# Patient Record
Sex: Male | Born: 1960
Health system: Southern US, Community
[De-identification: ages and names within clinical notes are randomized; demographics above are authoritative.]

## PROBLEM LIST (undated history)

## (undated) DIAGNOSIS — N401 Enlarged prostate with lower urinary tract symptoms: Secondary | ICD-10-CM

## (undated) DIAGNOSIS — S8262XA Displaced fracture of lateral malleolus of left fibula, initial encounter for closed fracture: Secondary | ICD-10-CM

## (undated) DIAGNOSIS — N138 Other obstructive and reflux uropathy: Secondary | ICD-10-CM

## (undated) DIAGNOSIS — G4730 Sleep apnea, unspecified: Secondary | ICD-10-CM

## (undated) DIAGNOSIS — I10 Essential (primary) hypertension: Secondary | ICD-10-CM

## (undated) DIAGNOSIS — K219 Gastro-esophageal reflux disease without esophagitis: Secondary | ICD-10-CM

## (undated) DIAGNOSIS — F419 Anxiety disorder, unspecified: Secondary | ICD-10-CM

## (undated) DIAGNOSIS — F439 Reaction to severe stress, unspecified: Secondary | ICD-10-CM

## (undated) HISTORY — DX: Anxiety disorder, unspecified: F41.9

## (undated) HISTORY — PX: COLONOSCOPY: SHX174

## (undated) HISTORY — DX: Other obstructive and reflux uropathy: N40.1

## (undated) HISTORY — DX: Reaction to severe stress, unspecified: F43.9

## (undated) HISTORY — DX: Gastro-esophageal reflux disease without esophagitis: K21.9

## (undated) HISTORY — PX: APPENDECTOMY: SHX54

## (undated) HISTORY — DX: Essential (primary) hypertension: I10

## (undated) HISTORY — DX: Other obstructive and reflux uropathy: N13.8

## (undated) HISTORY — PX: POLYPECTOMY: SHX149

## (undated) HISTORY — DX: Sleep apnea, unspecified: G47.30

---

## 2012-03-22 ENCOUNTER — Encounter: Payer: Self-pay | Admitting: Cardiology

## 2012-07-12 ENCOUNTER — Ambulatory Visit (INDEPENDENT_AMBULATORY_CARE_PROVIDER_SITE_OTHER): Payer: 59 | Admitting: Physician Assistant

## 2012-07-12 VITALS — BP 128/82 | HR 82 | Temp 98.0°F | Resp 17 | Ht 72.0 in | Wt 252.0 lb

## 2012-07-12 DIAGNOSIS — K219 Gastro-esophageal reflux disease without esophagitis: Secondary | ICD-10-CM

## 2012-07-12 MED ORDER — RANITIDINE HCL 150 MG PO TABS: 150.0000 mg | ORAL_TABLET | Freq: Two times a day (BID) | ORAL | Status: DC

## 2012-07-12 MED ORDER — OMEPRAZOLE 20 MG PO CPDR: 20.0000 mg | DELAYED_RELEASE_CAPSULE | Freq: Every day | ORAL | Status: DC

## 2012-07-12 NOTE — Progress Notes (Signed)
  Subjective:    Patient ID: Michael Berger, male    DOB: December 10, 1960, 51 y.o.   MRN: [id number]  HPI 51 year old male presents with 3 day history of heartburn, acid reflux, and some emesis. States emesis has been liquid and only after he eats and is experiencing reflux.  Complains of some epigastric pain associated with meals, but denies any pain right now.  Also admits to sensation of esophagus tightening and feeling as if his food "won't go down."  Has had episodes of this sensation in the past.  Denies fever, chills, nausea, vomiting, diarrhea, or constipation.  He has been taking Tums prn which does help temporarily.      Review of Systems  Constitutional: Negative for fever and chills.  HENT: Positive for sore throat (burning).   Respiratory: Negative for cough and wheezing.   Gastrointestinal: Positive for vomiting (bile). Negative for nausea, abdominal pain, diarrhea and constipation.  All other systems reviewed and are negative.       Objective:   Physical Exam  Constitutional: He is oriented to person, place, and time. He appears well-developed and well-nourished.  HENT:  Head: Normocephalic and atraumatic.  Right Ear: External ear normal.  Left Ear: External ear normal.  Eyes: Conjunctivae normal are normal.  Neck: Normal range of motion.  Cardiovascular: Normal rate.   Pulmonary/Chest: Effort normal.  Abdominal: Soft. Bowel sounds are normal.  Lymphadenopathy:    He has no cervical adenopathy.  Neurological: He is alert and oriented to person, place, and time.  Psychiatric: He has a normal mood and affect. His behavior is normal. Judgment and thought content normal.          Assessment & Plan:   1. GERD (gastroesophageal reflux disease)  ranitidine (ZANTAC) 150 MG tablet, omeprazole (PRILOSEC) 20 MG capsule, Ambulatory referral to Gastroenterology   Start Zantac bid Omeprazole bid x 2 weeks, then once daily. Maalox or Mylanta prn Refer to GI. Follow up as needed.

## 2012-07-12 NOTE — Patient Instructions (Signed)

## 2012-09-24 ENCOUNTER — Ambulatory Visit (INDEPENDENT_AMBULATORY_CARE_PROVIDER_SITE_OTHER): Payer: 59 | Admitting: Physician Assistant

## 2012-09-24 VITALS — BP 148/98 | HR 87 | Temp 99.6°F | Resp 18 | Wt 241.0 lb

## 2012-09-24 DIAGNOSIS — R030 Elevated blood-pressure reading, without diagnosis of hypertension: Secondary | ICD-10-CM

## 2012-09-24 DIAGNOSIS — IMO0001 Reserved for inherently not codable concepts without codable children: Secondary | ICD-10-CM

## 2012-09-24 DIAGNOSIS — J029 Acute pharyngitis, unspecified: Secondary | ICD-10-CM

## 2012-09-24 DIAGNOSIS — R0989 Other specified symptoms and signs involving the circulatory and respiratory systems: Secondary | ICD-10-CM

## 2012-09-24 MED ORDER — FIRST-DUKES MOUTHWASH MT SUSP: 10.0000 mL | OROMUCOSAL | Status: DC | PRN

## 2012-09-24 MED ORDER — GUAIFENESIN ER 1200 MG PO TB12: 1.0000 | ORAL_TABLET | Freq: Two times a day (BID) | ORAL | Status: DC | PRN

## 2012-09-24 NOTE — Progress Notes (Signed)
Subjective:    Patient ID: Michael Berger, male    DOB: [birth year], 51 y.o.   MRN: [id number]  HPI   Michael Berger is a 51 yr old male here with 2 days of URI symptoms.  Complaining of sore throat and chest congestion.  Very little cough, but when he has coughed it has been productive.  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  He denies nasal congestion or sinus pressure.  Has had some headache.  No ear pain.  He took one Zicam, but didn't like the way it made him feel.  Has not tried anything else for symptoms.  Denies fever but Michael Berger states he had chills last night.  He had his flu shot at [facility] earlier this season.    BP is elevated today, pt states he was previously on antihypertensives, but then taken off.  Has home monitor but has not been checking pressures regularly.    Review of Systems  Constitutional: Positive for chills. Negative for fever.  HENT: Positive for sore throat. Negative for ear pain, congestion, rhinorrhea and sinus pressure.   Respiratory: Positive for cough. Negative for wheezing.   Cardiovascular: Negative.   Gastrointestinal: Negative.   Musculoskeletal: Negative.   Skin: Negative.   Neurological: Positive for headaches.       Objective:   Physical Exam  Vitals reviewed. Constitutional: He is oriented to person, place, and time. He appears well-developed and well-nourished. No distress.  HENT:  Head: Normocephalic and atraumatic.  Right Ear: Tympanic membrane and ear canal normal.  Left Ear: Tympanic membrane normal.  Nose: Mucosal edema present. Right sinus exhibits no maxillary sinus tenderness and no frontal sinus tenderness. Left sinus exhibits no maxillary sinus tenderness and no frontal sinus tenderness.  Mouth/Throat: Uvula is midline and mucous membranes are normal. Posterior oropharyngeal erythema present. No oropharyngeal exudate, posterior oropharyngeal edema or tonsillar abscesses.  Neck: Neck  supple.  Cardiovascular: Normal rate, regular rhythm and normal heart sounds.  Exam reveals no gallop and no friction rub.   No murmur heard. Pulmonary/Chest: Effort normal and breath sounds normal. He has no wheezes. He has no rales.  Lymphadenopathy:    He has no cervical adenopathy.  Neurological: He is alert and oriented to person, place, and time.  Skin: Skin is warm and dry.  Psychiatric: He has a normal mood and affect. His behavior is normal.      Filed Vitals:   09/24/12 0840  BP: 148/98  Pulse: 87  Temp: 99.6 F (37.6 C)  Resp: 18       Assessment & Plan:   1. Sore throat  Diphenhyd-Hydrocort-Nystatin (FIRST-DUKES MOUTHWASH) SUSP  2. Chest congestion  Guaifenesin (MUCINEX MAXIMUM STRENGTH) 1200 MG TB12  3. Elevated blood pressure      Michael Berger is a very pleasant 51 yr old male here with URI, likely viral.  He has a low grade temp of 99.36F today, but exam was normal aside from pharyngeal erythema.  Pt was insistent that he needed a ZPak.  Discussed with him that I didn't think that was necessary at this time.  Encouraged plenty of fluids and rest.  Mucinex BID for chest congestion and [facility]'s magic mouthwash for sore throat.  Discussed RTC precautions, which patient understands.  If he is not improving in 3-4 days he will call and we can send in a ZPak and cough medication if needed.  He will let us know sooner if  he is worsening.   BP today is elevated at 148/98.  Instructed patient to check pressures at home about twice per week for the next 4-5 weeks.  If he continues to see numbers in the 140s/90s or higher, he will RTC to discuss restarting antihypertensives.

## 2012-09-24 NOTE — Patient Instructions (Addendum)
Begin using Mucinex twice daily for chest congestion.  Use Magic Mouthwash as frequently as every two hours for sore throat relief.  You may also use Advil or Tylenol for pain relief.  Drink plenty of fluids and get plenty of rest.  You may call back if you need cough medication, or if you are not improving in 3-4 days and we can send a ZPak in.  If you feel like you are worsening (fever > 101.5, worsening cough, worsening sore throat) let us know sooner.    Check your blood pressure at home about twice a week for the next 4-5 weeks.  If it is persistently 140/90 or high, come back in so we can discuss starting a blood pressure medication.      Upper Respiratory Infection, Adult An upper respiratory infection (URI) is also sometimes known as the common cold. The upper respiratory tract includes the nose, sinuses, throat, trachea, and bronchi. Bronchi are the airways leading to the lungs. Most people improve within 1 week, but symptoms can last up to 2 weeks. A residual cough may last even longer.  CAUSES Many different viruses can infect the tissues lining the upper respiratory tract. The tissues become irritated and inflamed and often become very moist. Mucus production is also common. A cold is contagious. You can easily spread the virus to others by oral contact. This includes kissing, sharing a glass, coughing, or sneezing. Touching your mouth or nose and then touching a surface, which is then touched by another person, can also spread the virus. SYMPTOMS  Symptoms typically develop 1 to 3 days after you come in contact with a cold virus. Symptoms vary from person to person. They may include:  Runny nose.  Sneezing.  Nasal congestion.  Sinus irritation.  Sore throat.  Loss of voice (laryngitis).  Cough.  Fatigue.  Muscle aches.  Loss of appetite.  Headache.  Low-grade fever. DIAGNOSIS  You might diagnose your own cold based on familiar symptoms, since most people get a cold 2  to 3 times a year. Your caregiver can confirm this based on your exam. Most importantly, your caregiver can check that your symptoms are not due to another disease such as strep throat, sinusitis, pneumonia, asthma, or epiglottitis. Blood tests, throat tests, and X-rays are not necessary to diagnose a common cold, but they may sometimes be helpful in excluding other more serious diseases. Your caregiver will decide if any further tests are required. RISKS AND COMPLICATIONS  You may be at risk for a more severe case of the common cold if you smoke cigarettes, have chronic heart disease (such as heart failure) or lung disease (such as asthma), or if you have a weakened immune system. The very young and very old are also at risk for more serious infections. Bacterial sinusitis, middle ear infections, and bacterial pneumonia can complicate the common cold. The common cold can worsen asthma and chronic obstructive pulmonary disease (COPD). Sometimes, these complications can require emergency medical care and may be life-threatening. PREVENTION  The best way to protect against getting a cold is to practice good hygiene. Avoid oral or hand contact with people with cold symptoms. Wash your hands often if contact occurs. There is no clear evidence that vitamin C, vitamin E, echinacea, or exercise reduces the chance of developing a cold. However, it is always recommended to get plenty of rest and practice good nutrition. TREATMENT  Treatment is directed at relieving symptoms. There is no cure. Antibiotics are not effective,  because the infection is caused by a virus, not by bacteria. Treatment may include:  Increased fluid intake. Sports drinks offer valuable electrolytes, sugars, and fluids.  Breathing heated mist or steam (vaporizer or shower).  Eating chicken soup or other clear broths, and maintaining good nutrition.  Getting plenty of rest.  Using gargles or lozenges for comfort.  Controlling fevers  with ibuprofen or acetaminophen as directed by your caregiver.  Increasing usage of your inhaler if you have asthma. Zinc gel and zinc lozenges, taken in the first 24 hours of the common cold, can shorten the duration and lessen the severity of symptoms. Pain medicines may help with fever, muscle aches, and throat pain. A variety of non-prescription medicines are available to treat congestion and runny nose. Your caregiver can make recommendations and may suggest nasal or lung inhalers for other symptoms.  HOME CARE INSTRUCTIONS   Only take over-the-counter or prescription medicines for pain, discomfort, or fever as directed by your caregiver.  Use a warm mist humidifier or inhale steam from a shower to increase air moisture. This may keep secretions moist and make it easier to breathe.  Drink enough water and fluids to keep your urine clear or pale yellow.  Rest as needed.  Return to work when your temperature has returned to normal or as your caregiver advises. You may need to stay home longer to avoid infecting others. You can also use a face mask and careful hand washing to prevent spread of the virus. SEEK MEDICAL CARE IF:   After the first few days, you feel you are getting worse rather than better.  You need your caregiver's advice about medicines to control symptoms.  You develop chills, worsening shortness of breath, or brown or red sputum. These may be signs of pneumonia.  You develop yellow or brown nasal discharge or pain in the face, especially when you bend forward. These may be signs of sinusitis.  You develop a fever, swollen neck glands, pain with swallowing, or white areas in the back of your throat. These may be signs of strep throat. SEEK IMMEDIATE MEDICAL CARE IF:   You have a fever.  You develop severe or persistent headache, ear pain, sinus pain, or chest pain.  You develop wheezing, a prolonged cough, cough up blood, or have a change in your usual mucus (if you  have chronic lung disease).  You develop sore muscles or a stiff neck. Document Released: 03/23/2001 Document Revised: 12/20/2011 Document Reviewed: 01/29/2011 [facility] Patient Information 2013 [place], [place].

## 2013-03-30 ENCOUNTER — Encounter: Payer: 59 | Admitting: Family Medicine

## 2013-07-06 ENCOUNTER — Ambulatory Visit (INDEPENDENT_AMBULATORY_CARE_PROVIDER_SITE_OTHER): Payer: 59 | Admitting: Family Medicine

## 2013-07-06 ENCOUNTER — Encounter: Payer: Self-pay | Admitting: Family Medicine

## 2013-07-06 VITALS — BP 150/104 | HR 73 | Temp 97.4°F | Resp 16 | Ht 71.0 in | Wt 235.0 lb

## 2013-07-06 DIAGNOSIS — Z23 Encounter for immunization: Secondary | ICD-10-CM

## 2013-07-06 DIAGNOSIS — E669 Obesity, unspecified: Secondary | ICD-10-CM

## 2013-07-06 DIAGNOSIS — R030 Elevated blood-pressure reading, without diagnosis of hypertension: Secondary | ICD-10-CM

## 2013-07-06 DIAGNOSIS — Z1211 Encounter for screening for malignant neoplasm of colon: Secondary | ICD-10-CM

## 2013-07-06 DIAGNOSIS — Z0000 Encounter for general adult medical examination without abnormal findings: Secondary | ICD-10-CM

## 2013-07-06 LAB — COMPREHENSIVE METABOLIC PANEL
ALT: 32 U/L (ref 0–53)
AST: 27 U/L (ref 0–37)
Albumin: 4.8 g/dL (ref 3.5–5.2)
Alkaline Phosphatase: 60 U/L (ref 39–117)
BUN: 17 mg/dL (ref 6–23)
CO2: 25 mEq/L (ref 19–32)
Calcium: 9.6 mg/dL (ref 8.4–10.5)
Chloride: 106 mEq/L (ref 96–112)
Creat: 1.33 mg/dL (ref 0.50–1.35)
Glucose, Bld: 92 mg/dL (ref 70–99)
Potassium: 4.3 mEq/L (ref 3.5–5.3)
Sodium: 138 mEq/L (ref 135–145)
Total Bilirubin: 0.6 mg/dL (ref 0.3–1.2)
Total Protein: 7.9 g/dL (ref 6.0–8.3)

## 2013-07-06 LAB — CBC
HCT: 47.9 % (ref 39.0–52.0)
Hemoglobin: 17 g/dL (ref 13.0–17.0)
MCH: 28.3 pg (ref 26.0–34.0)
MCHC: 35.5 g/dL (ref 30.0–36.0)
MCV: 79.8 fL (ref 78.0–100.0)
Platelets: 205 10*3/uL (ref 150–400)
RBC: 6 MIL/uL — ABNORMAL HIGH (ref 4.22–5.81)
RDW: 14.8 % (ref 11.5–15.5)
WBC: 6 10*3/uL (ref 4.0–10.5)

## 2013-07-06 LAB — LIPID PANEL
Cholesterol: 193 mg/dL (ref 0–200)
HDL: 40 mg/dL (ref >39)
LDL Cholesterol: 125 mg/dL — ABNORMAL HIGH (ref 0–99)
Total CHOL/HDL Ratio: 4.8 Ratio
Triglycerides: 142 mg/dL (ref <150)
VLDL: 28 mg/dL (ref 0–40)

## 2013-07-06 LAB — POCT URINALYSIS DIPSTICK
Bilirubin, UA: NEGATIVE
Glucose, UA: NEGATIVE
Ketones, UA: NEGATIVE
Leukocytes, UA: NEGATIVE
Nitrite, UA: NEGATIVE
Protein, UA: NEGATIVE
Spec Grav, UA: 1.025
Urobilinogen, UA: 0.2
pH, UA: 5.5

## 2013-07-06 LAB — IFOBT (OCCULT BLOOD): IFOBT: NEGATIVE

## 2013-07-06 MED ORDER — TADALAFIL 10 MG PO TABS: 10.0000 mg | ORAL_TABLET | Freq: Every day | ORAL | Status: DC | PRN

## 2013-07-06 NOTE — Patient Instructions (Signed)
Keeping you healthy  Get these tests  Blood pressure- Have your blood pressure checked once a year by your healthcare provider.  Normal blood pressure is 120/80  Weight- Have your body mass index (BMI) calculated to screen for obesity.  BMI is a measure of body fat based on height and weight. You can also calculate your own BMI at [email or web address].  Cholesterol- Have your cholesterol checked every year.  Diabetes- Have your blood sugar checked regularly if you have high blood pressure, high cholesterol, have a family history of diabetes or if you are overweight.  Screening for Colon Cancer- Colonoscopy starting at age 4.  Screening may begin sooner depending on your family history and other health conditions. Follow up colonoscopy as directed by your Gastroenterologist.  Screening for Prostate Cancer- Both blood work (PSA) and a rectal exam help screen for Prostate Cancer.  Screening begins at age 4 with African-American men and at age 69 with Caucasian men.  Screening may begin sooner depending on your family history.  Take these medicines  Aspirin- One aspirin daily can help prevent Heart disease and Stroke.  Flu shot- Every fall.  Tetanus- Every 10 years. You received a Tdap today; next Tetanus is due in 10 years.  Zostavax- Once after the age of 12 to prevent Shingles.  Pneumonia shot- Once after the age of 81; if you are younger than 98, ask your healthcare provider if you need a Pneumonia shot.  Take these steps  Don't smoke- If you do smoke, talk to your doctor about quitting.  For tips on how to quit, go to www.smokefree.gov or call 1-800-QUIT-NOW.  Be physically active- Exercise 5 days a week for at least 30 minutes.  If you are not already physically active start slow and gradually work up to 30 minutes of moderate physical activity.  Examples of moderate activity include walking briskly, mowing the yard, dancing, swimming, bicycling, etc.  Eat a healthy diet-  Eat a variety of healthy food such as fruits, vegetables, low fat milk, low fat cheese, yogurt, lean meant, poultry, fish, beans, tofu, etc. For more information go to www.thenutritionsource.org  Drink alcohol in moderation- Limit alcohol intake to less than two drinks a day. Never drink and drive.  Dentist- Brush and floss twice daily; visit your dentist twice a year.  Depression- Your emotional health is as important as your physical health. If you're feeling down, or losing interest in things you would normally enjoy please talk to your healthcare provider.  Eye exam- Visit your eye doctor every year.  Safe sex- If you may be exposed to a sexually transmitted infection, use a condom.  Seat belts- Seat belts can save your life; always wear one.  Smoke/Carbon Monoxide detectors- These detectors need to be installed on the appropriate level of your home.  Replace batteries at least once a year.  Skin cancer- When out in the sun, cover up and use sunscreen 15 SPF or higher.  Violence- If anyone is threatening you, please tell your healthcare provider.  Living Will/ Health care power of attorney- Speak with your healthcare provider and family.     Hypertension As your heart beats, it forces blood through your arteries. This force is your blood pressure. If the pressure is too high, it is called hypertension (HTN) or high blood pressure. HTN is dangerous because you may have it and not know it. High blood pressure may mean that your heart has to work harder to pump blood. Your arteries may be  narrow or stiff. The extra work puts you at risk for heart disease, stroke, and other problems.  Blood pressure consists of two numbers, a higher number over a lower, 110/72, for example. It is stated as "110 over 72." The ideal is below 120 for the top number (systolic) and under 80 for the bottom (diastolic). Write down your blood pressure today. You should pay close attention to your blood pressure  if you have certain conditions such as:  Heart failure.  Prior heart attack.  Diabetes  Chronic kidney disease.  Prior stroke.  Multiple risk factors for heart disease. To see if you have HTN, your blood pressure should be measured while you are seated with your arm held at the level of the heart. It should be measured at least twice. A one-time elevated blood pressure reading (especially in the Emergency Department) does not mean that you need treatment. There may be conditions in which the blood pressure is different between your right and left arms. It is important to see your caregiver soon for a recheck. Most people have essential hypertension which means that there is not a specific cause. This type of high blood pressure may be lowered by changing lifestyle factors such as:  Stress.  Smoking.  Lack of exercise.  Excessive weight.  Drug/tobacco/alcohol use.  Eating less salt. Most people do not have symptoms from high blood pressure until it has caused damage to the body. Effective treatment can often prevent, delay or reduce that damage. TREATMENT  When a cause has been identified, treatment for high blood pressure is directed at the cause. There are a large number of medications to treat HTN. These fall into several categories, and your caregiver will help you select the medicines that are best for you. Medications may have side effects. You should review side effects with your caregiver. If your blood pressure stays high after you have made lifestyle changes or started on medicines,   Your medication(s) may need to be changed.  Other problems may need to be addressed.  Be certain you understand your prescriptions, and know how and when to take your medicine.  Be sure to follow up with your caregiver within the time frame advised (usually within two weeks) to have your blood pressure rechecked and to review your medications.  If you are taking more than one medicine to  lower your blood pressure, make sure you know how and at what times they should be taken. Taking two medicines at the same time can result in blood pressure that is too low. SEEK IMMEDIATE MEDICAL CARE IF:  You develop a severe headache, blurred or changing vision, or confusion.  You have unusual weakness or numbness, or a faint feeling.  You have severe chest or abdominal pain, vomiting, or breathing problems. MAKE SURE YOU:   Understand these instructions.  Will watch your condition.  Will get help right away if you are not doing well or get worse. Document Released: 09/27/2005 Document Revised: 12/20/2011 Document Reviewed: 05/17/2008 [facility] Patient Information 2014 [place], [place].

## 2013-07-06 NOTE — Progress Notes (Signed)
Subjective:    Patient ID: Michael Berger, male    DOB: 18-Dec-1960, 52 y.o.   MRN: [id number]  HPI  This 52 y.o. AA male is here for CPE> He has had elevated BP in the past but is not currently taking medication. He is asymptomatic. Pt acknowledges that he is overweight; his goal is to lose ~ 20 lbs by increasing exercise and modifying nutrition.  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 He requests RX for Cialis.  HCM: CRS- needs referral.           Vision- every 2 years.           IMM- needs Tdap.   Review of Systems  Constitutional: Negative.   HENT: Negative.   Eyes: Negative.   Respiratory: Negative.   Cardiovascular: Negative.   Gastrointestinal: Negative.   Endocrine: Negative.   Genitourinary: Negative.   Musculoskeletal: Negative.   Skin: Negative.   Allergic/Immunologic: Negative.   Neurological: Negative.   Hematological: Negative.   Psychiatric/Behavioral: Negative.        Objective:   Physical Exam  Nursing note and vitals reviewed. Constitutional: He is oriented to person, place, and time. He appears well-developed and well-nourished. No distress.  Pt very muscular.  HENT:  Head: Normocephalic and atraumatic.  Eyes: EOM and lids are normal. Pupils are equal, round, and reactive to light. Right conjunctiva is injected. Left conjunctiva is injected. No scleral icterus.  Fundoscopic exam:      The right eye shows arteriolar narrowing. The right eye shows no papilledema. The right eye shows red reflex.       The left eye shows arteriolar narrowing. The left eye shows no papilledema. The left eye shows red reflex.  Fundoscopic exam difficult.  Neck: Full passive range of motion without pain. Neck supple. No hepatojugular reflux and no JVD present. No spinous process tenderness and no muscular tenderness present. Normal range of motion present. No mass and no thyromegaly present.  Slight reduction in ROM -rotation, flexion and extension.  Cardiovascular:  Normal rate, regular rhythm, S1 normal, S2 normal and intact distal pulses.   No extrasystoles are present. Exam reveals distant heart sounds. Exam reveals no gallop and no friction rub.   No murmur heard. Pulses:      Carotid pulses are 1+ on the right side, and 1+ on the left side.      Radial pulses are 1+ on the right side, and 1+ on the left side.       Femoral pulses are 1+ on the right side, and 1+ on the left side.      Popliteal pulses are 1+ on the right side, and 1+ on the left side.       Dorsalis pedis pulses are 1+ on the right side, and 1+ on the left side.       Posterior tibial pulses are 1+ on the right side, and 1+ on the left side.  Pulmonary/Chest: Effort normal and breath sounds normal. No respiratory distress.  BS distant.  Abdominal: Soft. Normal appearance and bowel sounds are normal. He exhibits no distension, no pulsatile midline mass and no mass. There is no hepatosplenomegaly. There is no tenderness. There is no guarding and no CVA tenderness. No hernia.  Adipose tissue prohibits adequate assessment of intra-abd organs.  Genitourinary: Rectum normal and prostate normal. Rectal exam shows no external hemorrhoid, no fissure, no mass, no tenderness and anal tone normal. Guaiac negative stool. Prostate is not tender.  Musculoskeletal: Normal range of motion. He exhibits no edema and no tenderness.  Lymphadenopathy:       Head (right side): No submental, no submandibular, no tonsillar, no posterior auricular and no occipital adenopathy present.       Head (left side): No submental, no submandibular, no tonsillar, no posterior auricular and no occipital adenopathy present.    He has no cervical adenopathy.       Right: No inguinal and no supraclavicular adenopathy present.       Left: No inguinal and no supraclavicular adenopathy present.  Neurological: He is alert and oriented to person, place, and time. He has normal strength. He displays no atrophy. No cranial nerve  deficit or sensory deficit. He exhibits normal muscle tone. Coordination and gait normal.  Skin: Skin is warm, dry and intact. No ecchymosis, no lesion and no rash noted. He is not diaphoretic. No cyanosis. Nails show no clubbing.  Psychiatric: He has a normal mood and affect. His speech is normal and behavior is normal. Judgment and thought content normal. Cognition and memory are normal.   BP re-check at end of visit: 140/98   Results for orders placed in visit on 07/06/13  POCT URINALYSIS DIPSTICK      Result Value Range   Color, UA yellow     Clarity, UA clear     Glucose, UA neg     Bilirubin, UA neg     Ketones, UA neg     Spec Grav, UA 1.025     Blood, UA trace-intact     pH, UA 5.5     Protein, UA neg     Urobilinogen, UA 0.2     Nitrite, UA neg     Leukocytes, UA Negative    IFOBT (OCCULT BLOOD)      Result Value Range   IFOBT Negative      ECG: 2011- NSR; no ST-T wave changes.     Assessment & Plan:  Routine general medical examination at a health care facility - Plan: POCT urinalysis dipstick, IFOBT POC (occult bld, rslt in office), Tdap vaccine greater than or equal to 7yo IM, CBC, Comprehensive metabolic panel, Lipid panel, PSA  Elevated blood pressure reading without diagnosis of hypertension - Monitor BP at home; modify nutrition and exercise for weight loss.   Plan: T3, free, TSH  Obesity, unspecified- Discussed nutrition changes; pt tends to eat fast food for breakfast (sausage biscuits, tater tots, etc). Advised to decrease portion sizes and processed foods; increase fruits and vegetables. No added salt.  Screening for colorectal cancer - Plan: Ambulatory referral to Gastroenterology

## 2013-07-07 LAB — PSA: PSA: 0.98 ng/mL (ref <=4.00)

## 2013-07-07 LAB — T3, FREE: T3, Free: 4 pg/mL (ref 2.3–4.2)

## 2013-07-07 LAB — TSH: TSH: 2.152 u[IU]/mL (ref 0.350–4.500)

## 2013-07-12 NOTE — Progress Notes (Signed)
Quick Note:  Please advise pt regarding following labs...  All labs are normal with an above normal LDL ("bad") cholesterol. Better nutrition and regular physcial activity will help lower the LDL cholesterol. Your heart disease risk is average with these lipid values.  Copy to pt. ______

## 2013-07-23 ENCOUNTER — Other Ambulatory Visit: Payer: Self-pay | Admitting: Physician Assistant

## 2013-07-23 ENCOUNTER — Ambulatory Visit (INDEPENDENT_AMBULATORY_CARE_PROVIDER_SITE_OTHER): Payer: 59 | Admitting: Family Medicine

## 2013-07-23 ENCOUNTER — Ambulatory Visit: Payer: 59

## 2013-07-23 VITALS — BP 140/90 | HR 77 | Temp 97.7°F | Resp 16 | Ht 71.5 in | Wt 230.0 lb

## 2013-07-23 DIAGNOSIS — K219 Gastro-esophageal reflux disease without esophagitis: Secondary | ICD-10-CM

## 2013-07-23 DIAGNOSIS — R030 Elevated blood-pressure reading, without diagnosis of hypertension: Secondary | ICD-10-CM

## 2013-07-23 DIAGNOSIS — N529 Male erectile dysfunction, unspecified: Secondary | ICD-10-CM

## 2013-07-23 DIAGNOSIS — R42 Dizziness and giddiness: Secondary | ICD-10-CM

## 2013-07-23 MED ORDER — TADALAFIL 10 MG PO TABS: 10.0000 mg | ORAL_TABLET | Freq: Every day | ORAL | Status: DC | PRN

## 2013-07-23 MED ORDER — OMEPRAZOLE 20 MG PO CPDR: 20.0000 mg | DELAYED_RELEASE_CAPSULE | Freq: Every day | ORAL | Status: DC

## 2013-07-23 NOTE — Progress Notes (Signed)
[facility] [address], [place] [address] [phone]  Date:  07/23/2013   Name:  Michael Berger   DOB:  08-Mar-1961   MRN:  [id number]  PCP:  No primary provider on file.    Chief Complaint: Gastrophageal Reflux, Sneezing and Medication Refill   History of Present Illness:  Michael Berger is a 52 y.o. very pleasant male patient who presents with the following:  Here today for a medication refill and for a couple of other concerns.   He has taken omeprazole as needed in the past.  He has had reflux for a year or so and needs a refill today  He used to eat a lot of peppermints and drink lemonade; he has stopped this and is overall improved.    For the last couple of days he thinks that his reflux has acted up again due to spicy foods.  He does not have abdominal pain or CP. However he has noted some discomfort in his abdomen and has vomited a few times, which is typical of reflux for him.    He was noted to have elelvated BP when he went for his colonoscopy pre-check last week  He is concerned about an episode that happened about 10 days ago; he went outside and suddenly felt very weak and tired.  He was sweating and felt SOB.  No CP.  He laid down and felt better in about 10 minutes.  He did eat a snack which seemed to help. He is worried about this episode and wants to be sure all is ok  He also notes that he has had a slight stuffy nose and sneezing for the past couple of days  Patient Active Problem List   Diagnosis Date Noted  . Elevated blood pressure reading without diagnosis of hypertension 07/06/2013  . Obesity, unspecified 07/06/2013    Past Medical History  Diagnosis Date  . Anxiety   . Stress   . Hypertension   . GERD (gastroesophageal reflux disease)     Past Surgical History  Procedure Laterality Date  . Appendectomy      Age 45    History  Substance Use Topics  . Smoking status: Never Smoker   . Smokeless tobacco: Not on file   . Alcohol Use: No    Family History  Problem Relation Age of Onset  . Hypertension Mother     No Known Allergies  Medication list has been reviewed and updated.  Current Outpatient Prescriptions on File Prior to Visit  Medication Sig Dispense Refill  . omeprazole (PRILOSEC) 20 MG capsule Take 1 capsule (20 mg total) by mouth daily.  30 capsule  3  . tadalafil (CIALIS) 10 MG tablet Take 1 tablet (10 mg total) by mouth daily as needed for erectile dysfunction.  10 tablet  0   No current facility-administered medications on file prior to visit.    Review of Systems:  As per HPI- otherwise negative.   Physical Examination: Filed Vitals:   07/23/13 1033  BP: 140/90  Pulse: 77  Temp: 97.7 F (36.5 C)  Resp: 16   Filed Vitals:   07/23/13 1033  Height: 5' 11.5" (1.816 m)  Weight: 230 lb (104.327 kg)   Body mass index is 31.63 kg/(m^2). Ideal Body Weight: Weight in (lb) to have BMI = 25: 181.4  GEN: WDWN, NAD, Non-toxic, A & O x 3, looks well HEENT: Atraumatic, Normocephalic. Neck supple. No masses, No LAD.  Bilateral TM  wnl, oropharynx normal.  PEERL,EOMI.   Ears and Nose: No external deformity. CV: RRR, No M/G/R. No JVD. No thrill. No extra heart sounds. PULM: CTA B, no wheezes, crackles, rhonchi. No retractions. No resp. distress. No accessory muscle use. ABD: S, NT, ND. No rebound. No HSM. EXTR: No c/c/e NEURO Normal gait.  PSYCH: Normally interactive. Conversant. Not depressed or anxious appearing.  Calm demeanor.   EKG: low voltage, otherwise normal  [facility] reading (PRIMARY) by  Dr. Patsy Lager. CXR:  Enlarged heart, lungs appear clear.    Assessment and Plan: Borderline high blood pressure - Plan: EKG 12-Lead  Lightheaded - Plan: DG Chest 2 View  GERD (gastroesophageal reflux disease) - Plan: omeprazole (PRILOSEC) 20 MG capsule  Erectile dysfunction - Plan: tadalafil (CIALIS) 10 MG tablet  Jermarion is here today with a few concerns.  Refilled his prilosec and  cialis. Instructed him to make sure cardiology feels he is safe to take cialis prior to using this again.   He has a concerning appearance of his heart on CXR- ? Possible pericardial effusion- as well as low voltage on EKG.   Referral to [facility] today- appreciate their care of this nice patient.    BP is borderline today; encouraged weight loss of 5 to 10 lbs and continue to follow;    Signed Abbe Amsterdam, MD

## 2013-07-23 NOTE — Patient Instructions (Signed)
Please go to [facility] at 1:30 this afternoon for an evaluation  [address] [phone]  I will refill your other medications, but make sure all is ok with your heart prior to using the Cialis.

## 2013-09-22 ENCOUNTER — Ambulatory Visit (INDEPENDENT_AMBULATORY_CARE_PROVIDER_SITE_OTHER): Payer: 59 | Admitting: Emergency Medicine

## 2013-09-22 VITALS — BP 118/70 | HR 81 | Temp 98.1°F | Resp 16 | Ht 72.0 in | Wt 233.8 lb

## 2013-09-22 DIAGNOSIS — J018 Other acute sinusitis: Secondary | ICD-10-CM

## 2013-09-22 DIAGNOSIS — H1030 Unspecified acute conjunctivitis, unspecified eye: Secondary | ICD-10-CM

## 2013-09-22 MED ORDER — PSEUDOEPHEDRINE-GUAIFENESIN ER 60-600 MG PO TB12: 1.0000 | ORAL_TABLET | Freq: Two times a day (BID) | ORAL | Status: AC

## 2013-09-22 MED ORDER — OFLOXACIN 0.3 % OP SOLN: 1.0000 [drp] | Freq: Four times a day (QID) | OPHTHALMIC | Status: DC

## 2013-09-22 MED ORDER — AMOXICILLIN-POT CLAVULANATE 875-125 MG PO TABS: 1.0000 | ORAL_TABLET | Freq: Two times a day (BID) | ORAL | Status: DC

## 2013-09-22 NOTE — Progress Notes (Signed)
[facility] [address], [place] [address] [phone]  Date:  09/22/2013   Name:  Michael Berger   DOB:  [birth year]   MRN:  [id number]  PCP:  No primary provider on file.    Chief Complaint: Cough, Sore Throat, Conjunctivitis and Nasal Congestion   History of Present Illness:  Michael Berger is a 52 y.o. very pleasant male patient who presents with the following:  Ill this week with sore throat and stopped up nose.  Has some purulent drainage.  No fever but has chills.  Headache.  Left eye is bothering him and has a thick yellow drainage.  Mostly non productive cough.  Sometimes purulent.  Says he feels some difficulty breathing with exertion .  No wheezing.  No improvement with over the counter medications or other home remedies. Denies other complaint or health concern today.   Patient Active Problem List   Diagnosis Date Noted  . Elevated blood pressure reading without diagnosis of hypertension 07/06/2013  . Obesity, unspecified 07/06/2013    Past Medical History  Diagnosis Date  . Anxiety   . Stress   . Hypertension   . GERD (gastroesophageal reflux disease)     Past Surgical History  Procedure Laterality Date  . Appendectomy      Age 31    History  Substance Use Topics  . Smoking status: Current Some Day Smoker    Types: Cigars  . Smokeless tobacco: Not on file  . Alcohol Use: No    Family History  Problem Relation Age of Onset  . Hypertension Mother     No Known Allergies  Medication list has been reviewed and updated.  Current Outpatient Prescriptions on File Prior to Visit  Medication Sig Dispense Refill  . omeprazole (PRILOSEC) 20 MG capsule Take 1 capsule (20 mg total) by mouth daily.  30 capsule  3  . tadalafil (CIALIS) 10 MG tablet Take 1 tablet (10 mg total) by mouth daily as needed for erectile dysfunction.  10 tablet  6   No current facility-administered medications on file prior to visit.    Review of  Systems:  As per HPI, otherwise negative.    Physical Examination: Filed Vitals:   09/22/13 1237  BP: 118/70  Pulse: 81  Temp: 98.1 F (36.7 C)  Resp: 16   Filed Vitals:   09/22/13 1237  Height: 6' (1.829 m)  Weight: 233 lb 12.8 oz (106.051 kg)   Body mass index is 31.7 kg/(m^2). Ideal Body Weight: Weight in (lb) to have BMI = 25: 183.9  GEN: WDWN, NAD, Non-toxic, A & O x 3 HEENT: Atraumatic, Normocephalic. Neck supple. No masses, No LAD.  Left conjunctival injection and marginal lid crusting.   Ears and Nose: No external deformity. CV: RRR, No M/G/R. No JVD. No thrill. No extra heart sounds. PULM: CTA B, no wheezes, crackles, rhonchi. No retractions. No resp. distress. No accessory muscle use. ABD: S, NT, ND, +BS. No rebound. No HSM. EXTR: No c/c/e NEURO Normal gait.  PSYCH: Normally interactive. Conversant. Not depressed or anxious appearing.  Calm demeanor.    Assessment and Plan: Sinusitis Conjunctivitis Ocuflox augemntin mucinex d  Signed,  Phillips Odor, MD

## 2013-09-22 NOTE — Patient Instructions (Signed)
Conjunctivitis °Conjunctivitis is commonly called "pink eye." Conjunctivitis can be caused by bacterial or viral infection, allergies, or injuries. There is usually redness of the lining of the eye, itching, discomfort, and sometimes discharge. There may be deposits of matter along the eyelids. A viral infection usually causes a watery discharge, while a bacterial infection causes a yellowish, thick discharge. Pink eye is very contagious and spreads by direct contact. °You may be given antibiotic eyedrops as part of your treatment. Before using your eye medicine, remove all drainage from the eye by washing gently with warm water and cotton balls. Continue to use the medication until you have awakened 2 mornings in a row without discharge from the eye. Do not rub your eye. This increases the irritation and helps spread infection. Use separate towels from other household members. Wash your hands with soap and water before and after touching your eyes. Use cold compresses to reduce pain and sunglasses to relieve irritation from light. Do not wear contact lenses or wear eye makeup until the infection is gone. °SEEK MEDICAL CARE IF:  °· Your symptoms are not better after 3 days of treatment. °· You have increased pain or trouble seeing. °· The outer eyelids become very red or swollen. °Document Released: 11/04/2004 Document Revised: 12/20/2011 Document Reviewed: 09/27/2005 °ExitCare® Patient Information ©2014 [employer]. ° °Sinusitis °Sinusitis is redness, soreness, and swelling (inflammation) of the paranasal sinuses. Paranasal sinuses are air pockets within the bones of your face (beneath the eyes, the middle of the forehead, or above the eyes). In healthy paranasal sinuses, mucus is able to drain out, and air is able to circulate through them by way of your nose. However, when your paranasal sinuses are inflamed, mucus and air can become trapped. This can allow bacteria and other germs to grow and cause  infection. °Sinusitis can develop quickly and last only a short time (acute) or continue over a long period (chronic). Sinusitis that lasts for more than 12 weeks is considered chronic.  °CAUSES  °Causes of sinusitis include: °· Allergies. °· Structural abnormalities, such as displacement of the cartilage that separates your nostrils (deviated septum), which can decrease the air flow through your nose and sinuses and affect sinus drainage. °· Functional abnormalities, such as when the small hairs (cilia) that line your sinuses and help remove mucus do not work properly or are not present. °SYMPTOMS  °Symptoms of acute and chronic sinusitis are the same. The primary symptoms are pain and pressure around the affected sinuses. Other symptoms include: °· Upper toothache. °· Earache. °· Headache. °· Bad breath. °· Decreased sense of smell and taste. °· A cough, which worsens when you are lying flat. °· Fatigue. °· Fever. °· Thick drainage from your nose, which often is green and may contain pus (purulent). °· Swelling and warmth over the affected sinuses. °DIAGNOSIS  °Your caregiver will perform a physical exam. During the exam, your caregiver may: °· Look in your nose for signs of abnormal growths in your nostrils (nasal polyps). °· Tap over the affected sinus to check for signs of infection. °· View the inside of your sinuses (endoscopy) with a special imaging device with a light attached (endoscope), which is inserted into your sinuses. °If your caregiver suspects that you have chronic sinusitis, one or more of the following tests may be recommended: °· Allergy tests. °· Nasal culture A sample of mucus is taken from your nose and sent to a lab and screened for bacteria. °· Nasal cytology A sample   of mucus is taken from your nose and examined by your caregiver to determine if your sinusitis is related to an allergy. °TREATMENT  °Most cases of acute sinusitis are related to a viral infection and will resolve on their  own within 10 days. Sometimes medicines are prescribed to help relieve symptoms (pain medicine, decongestants, nasal steroid sprays, or saline sprays).  °However, for sinusitis related to a bacterial infection, your caregiver will prescribe antibiotic medicines. These are medicines that will help kill the bacteria causing the infection.  °Rarely, sinusitis is caused by a fungal infection. In theses cases, your caregiver will prescribe antifungal medicine. °For some cases of chronic sinusitis, surgery is needed. Generally, these are cases in which sinusitis recurs more than 3 times per year, despite other treatments. °HOME CARE INSTRUCTIONS  °· Drink plenty of water. Water helps thin the mucus so your sinuses can drain more easily. °· Use a humidifier. °· Inhale steam 3 to 4 times a day (for example, sit in the bathroom with the shower running). °· Apply a warm, moist washcloth to your face 3 to 4 times a day, or as directed by your caregiver. °· Use saline nasal sprays to help moisten and clean your sinuses. °· Take over-the-counter or prescription medicines for pain, discomfort, or fever only as directed by your caregiver. °SEEK IMMEDIATE MEDICAL CARE IF: °· You have increasing pain or severe headaches. °· You have nausea, vomiting, or drowsiness. °· You have swelling around your face. °· You have vision problems. °· You have a stiff neck. °· You have difficulty breathing. °MAKE SURE YOU:  °· Understand these instructions. °· Will watch your condition. °· Will get help right away if you are not doing well or get worse. °Document Released: 09/27/2005 Document Revised: 12/20/2011 Document Reviewed: 10/12/2011 °ExitCare® Patient Information ©2014 [employer]. ° °

## 2013-11-03 ENCOUNTER — Other Ambulatory Visit: Payer: Self-pay | Admitting: Family Medicine

## 2013-11-07 ENCOUNTER — Other Ambulatory Visit: Payer: Self-pay | Admitting: Family Medicine

## 2013-11-07 ENCOUNTER — Telehealth: Payer: Self-pay

## 2013-11-07 DIAGNOSIS — N529 Male erectile dysfunction, unspecified: Secondary | ICD-10-CM

## 2013-11-07 NOTE — Telephone Encounter (Signed)
PT STATES THE PHARMACY IS TRYING TO GET A REFILL ON HIS CIALIS, BUT WOULD LIKE TO KNOW IF DR MCPHERSON WOULD UP THE MGS. PLEASE CALL  [phone]   [facility] [address]

## 2013-11-08 MED ORDER — TADALAFIL 10 MG PO TABS: 10.0000 mg | ORAL_TABLET | Freq: Every day | ORAL | Status: DC | PRN

## 2013-11-08 NOTE — Telephone Encounter (Signed)
Please advise Dr. Leward Quan

## 2013-11-08 NOTE — Telephone Encounter (Signed)
I will refill Cialis at current dose. Pt had visit in Oct 2014 at 102 and note says he was being referred to [facility]; he was advised to discuss safety of continuing this drug with the specialist. He needs to schedule follow-up appt w/ me re: HTN in order to continue receiving this medication.

## 2013-11-09 NOTE — Telephone Encounter (Signed)
Spoke with patient and let him know about prescription.  He will call next week to schedule an appointment./

## 2014-04-26 ENCOUNTER — Encounter: Payer: Self-pay | Admitting: Family Medicine

## 2014-04-26 ENCOUNTER — Ambulatory Visit (INDEPENDENT_AMBULATORY_CARE_PROVIDER_SITE_OTHER): Payer: 59 | Admitting: Family Medicine

## 2014-04-26 VITALS — BP 127/86 | HR 75 | Temp 98.1°F | Resp 16 | Ht 71.5 in | Wt 233.4 lb

## 2014-04-26 DIAGNOSIS — N528 Other male erectile dysfunction: Secondary | ICD-10-CM

## 2014-04-26 DIAGNOSIS — N529 Male erectile dysfunction, unspecified: Secondary | ICD-10-CM | POA: Insufficient documentation

## 2014-04-26 DIAGNOSIS — I10 Essential (primary) hypertension: Secondary | ICD-10-CM

## 2014-04-26 MED ORDER — TADALAFIL 20 MG PO TABS: 20.0000 mg | ORAL_TABLET | Freq: Every day | ORAL | Status: DC | PRN

## 2014-04-26 MED ORDER — LOSARTAN POTASSIUM-HCTZ 100-12.5 MG PO TABS: 1.0000 | ORAL_TABLET | Freq: Every day | ORAL | Status: DC

## 2014-04-26 MED ORDER — OMEPRAZOLE 20 MG PO CPDR: 20.0000 mg | DELAYED_RELEASE_CAPSULE | Freq: Every day | ORAL | Status: DC

## 2014-04-28 DIAGNOSIS — I10 Essential (primary) hypertension: Secondary | ICD-10-CM | POA: Insufficient documentation

## 2014-04-28 NOTE — Progress Notes (Signed)
S:  This 53 y.o. AA male has HTN; he is complaint w/ medication w/o adverse effects. Pt has ED and has found Cialis 10 mg ineffective. He would like to try a higher dose. He had no adverse events w/ Cialis 10 mg.  Patient Active Problem List   Diagnosis Date Noted  . Erectile dysfunction 04/26/2014  . Elevated blood pressure reading without diagnosis of hypertension 07/06/2013  . Obesity, unspecified 07/06/2013    Prior to Admission medications   Medication Sig Start Date End Date Taking? Authorizing Provider  losartan-hydrochlorothiazide (HYZAAR) 100-12.5 MG per tablet Take 1 tablet by mouth daily.   Yes Barton Fanny, MD  omeprazole (PRILOSEC) 20 MG capsule Take 1 capsule (20 mg total) by mouth daily.   Yes Barton Fanny, MD  ofloxacin (OCUFLOX) 0.3 % ophthalmic solution Place 1 drop into the left eye 4 (four) times daily. 09/22/13   Ellison Carwin, MD  pseudoephedrine-guaifenesin [facility] D) 60-600 MG per tablet Take 1 tablet by mouth every 12 (twelve) hours. 09/22/13 09/22/14  Ellison Carwin, MD  tadalafil (CIALIS) 10 MG tablet Take 1 tablet (10 mg total) by mouth daily as needed for erectile dysfunction.    Barton Fanny, MD   PMHx, Surg Hx, Soc and Fam Hx reviewed.  ROS: Negative for fatigue, diaphoresis, vision disturbances, CP or tightness, palpitations, edema, SOB or DOE, cough, abd or back pain, HA, dizziness, numbness, weakness or syncope.  O: Filed Vitals:   04/26/14 1210  BP: 127/86  Pulse: 75  Temp: 98.1 F (36.7 C)  Resp: 16   GEN: In NAD; WN,WD. HENT: [place]/AT. EOMI w/ clear conj/sclerae. Otherwise unremarkable. COR: RRR. LUNGS: Unlabored resp. SKIN: W&D; intact w/o diaphoresis or erythema. MS: MAEs; no deformities, c/c/e. NEURO: A&O x 3; CNs intact. Nonfocal.  A/P: Other male erectile dysfunction- Trial Cialis 20 mg  1 tablet as directed prior to sexual activity.  Essential hypertension- Stable and controlled on current medications.  Meds  ordered this encounter  Medications  . losartan-hydrochlorothiazide (HYZAAR) 100-12.5 MG per tablet    Sig: Take 1 tablet by mouth daily.    Dispense:  90 tablet    Refill:  3  . omeprazole (PRILOSEC) 20 MG capsule    Sig: Take 1 capsule (20 mg total) by mouth daily.    Dispense:  30 capsule    Refill:  3  . tadalafil (CIALIS) 20 MG tablet    Sig: Take 1 tablet (20 mg total) by mouth daily as needed for erectile dysfunction.    Dispense:  10 tablet    Refill:  3

## 2015-01-28 ENCOUNTER — Telehealth: Payer: Self-pay

## 2015-01-28 MED ORDER — SILDENAFIL CITRATE 50 MG PO TABS: 50.0000 mg | ORAL_TABLET | Freq: Every day | ORAL | Status: DC | PRN

## 2015-01-28 NOTE — Telephone Encounter (Signed)
Viagra 50 mg prescribed for pt to try. If this is not effective, he will need to sch OV to discuss; his last CPE was 07/06/2013. I last saw him in July 2015.

## 2015-01-28 NOTE — Telephone Encounter (Signed)
Pt wants a prescription for Viagra in place of his Cialis. The tadalafil (CIALIS) 20 MG tablet [[id number]]  gives him a headache. Please advise at [phone]

## 2015-01-29 NOTE — Telephone Encounter (Signed)
lmom informing pt

## 2015-04-13 ENCOUNTER — Other Ambulatory Visit: Payer: Self-pay | Admitting: Family Medicine

## 2015-06-13 ENCOUNTER — Other Ambulatory Visit: Payer: Self-pay | Admitting: Family Medicine

## 2015-06-13 NOTE — Telephone Encounter (Signed)
Last OV 04/26/2014, Last filled 04/26/2014

## 2015-06-18 ENCOUNTER — Ambulatory Visit (INDEPENDENT_AMBULATORY_CARE_PROVIDER_SITE_OTHER): Payer: Commercial Managed Care - HMO | Admitting: Family Medicine

## 2015-06-18 ENCOUNTER — Telehealth: Payer: Self-pay

## 2015-06-18 VITALS — BP 132/84 | HR 88 | Temp 98.4°F | Resp 16 | Ht 71.5 in | Wt 247.6 lb

## 2015-06-18 DIAGNOSIS — I10 Essential (primary) hypertension: Secondary | ICD-10-CM | POA: Diagnosis not present

## 2015-06-18 DIAGNOSIS — R0683 Snoring: Secondary | ICD-10-CM

## 2015-06-18 DIAGNOSIS — Z1211 Encounter for screening for malignant neoplasm of colon: Secondary | ICD-10-CM | POA: Diagnosis not present

## 2015-06-18 DIAGNOSIS — Z1212 Encounter for screening for malignant neoplasm of rectum: Secondary | ICD-10-CM | POA: Diagnosis not present

## 2015-06-18 DIAGNOSIS — R5382 Chronic fatigue, unspecified: Secondary | ICD-10-CM | POA: Diagnosis not present

## 2015-06-18 NOTE — Telephone Encounter (Signed)
Spoke with pt, advised him to RTC and to establish care with another doctor. I advised him Dr. Leward Quan has retired. Pt understood.

## 2015-06-18 NOTE — Progress Notes (Signed)
Subjective:    Patient ID: Michael Berger, male    DOB: 10-26-1960, 54 y.o.   MRN: [id number] This chart was scribed for Delman Cheadle, MD by Zola Button, Medical Scribe. This patient was seen in Room 5 and the patient's care was started at 2:31 PM.   Chief Complaint  Patient presents with  . referral for sleep study   HPI HPI Comments: Michael Berger is a 54 y.o. male who [facility] for a sleep study. Patient notes that he does snore and wakes up feeling fatigued. He feels he may stop breathing at night; sometimes he wakes up due to a sudden gag. He states he smokes a cigar occasionally. Patient denies sore throat and problems with allergies.  Patient called in earlier today asking for a referral for a sleep study. He had been seeing Dr. Leward Quan previously and agreed to establish care with another provider. He would like to establish care with me.  Past Medical History  Diagnosis Date  . Anxiety   . Stress   . Hypertension   . GERD (gastroesophageal reflux disease)    Current Outpatient Prescriptions on File Prior to Visit  Medication Sig Dispense Refill  . losartan-hydrochlorothiazide (HYZAAR) 100-12.5 MG per tablet Take 1 tablet by mouth daily. 90 tablet 3  . omeprazole (PRILOSEC) 20 MG capsule Take 1 capsule (20 mg total) by mouth daily. PATIENT NEEDS OFFICE VISIT FOR ADDITIONAL REFILLS 30 capsule 0  . sildenafil (VIAGRA) 50 MG tablet Take 1 tablet (50 mg total) by mouth daily as needed for erectile dysfunction. 10 tablet 1  . ofloxacin (OCUFLOX) 0.3 % ophthalmic solution Place 1 drop into the left eye 4 (four) times daily. 5 mL 0   No current facility-administered medications on file prior to visit.   No Known Allergies  Review of Systems  Constitutional: Positive for fatigue and unexpected weight change. Negative for fever, activity change and appetite change.  HENT: Negative for congestion, rhinorrhea and sore throat.     Respiratory: Positive for apnea. Negative for cough and shortness of breath.   Cardiovascular: Negative for chest pain and palpitations.  Allergic/Immunologic: Negative for environmental allergies.  Neurological: Positive for headaches. Negative for dizziness, syncope, weakness and light-headedness.  Psychiatric/Behavioral: Positive for sleep disturbance.       Objective:  BP 132/84 mmHg  Pulse 88  Temp(Src) 98.4 F (36.9 C) (Oral)  Resp 16  Ht 5' 11.5" (1.816 m)  Wt 247 lb 9.6 oz (112.311 kg)  BMI 34.06 kg/m2  SpO2 97%  Physical Exam  Constitutional: He is oriented to person, place, and time. He appears well-developed and well-nourished. No distress.  HENT:  Head: Normocephalic and atraumatic.  Mouth/Throat: Oropharynx is clear and moist. No oropharyngeal exudate.  Left tonsil 3+, right tonsil 1+. Nares pale and boggy, no rhinorrhea or congestion.   Eyes: Pupils are equal, round, and reactive to light.  Neck: Neck supple. No thyromegaly present.  Thyroid normal.  Cardiovascular: Normal rate.   Pulmonary/Chest: Effort normal.  Musculoskeletal: He exhibits no edema.  Lymphadenopathy:    He has no cervical adenopathy.  Neurological: He is alert and oriented to person, place, and time. No cranial nerve deficit.  Skin: Skin is warm and dry. No rash noted.  Psychiatric: He has a normal mood and affect. His behavior is normal.  Nursing note and vitals reviewed.         Assessment & Plan:   1. Chronic fatigue -  HIGHLY likely pt has osa - sxs and exam textbook - refer for split night sleep study.  2. Snoring   3. Essential hypertension, benign   4. Encounter for colorectal cancer screening - refer to GI    Orders Placed This Encounter  Procedures  . Ambulatory referral to Sleep Studies    Referral Priority:  Routine    Referral Type:  Consultation    Referral Reason:  Specialty Services Required    Number of Visits Requested:  1  . Ambulatory referral to  Gastroenterology    Referral Priority:  Routine    Referral Type:  Consultation    Referral Reason:  Specialty Services Required    Number of Visits Requested:  1     I personally performed the services described in this documentation, which was scribed in my presence. The recorded information has been reviewed and considered, and addended by me as needed.  Delman Cheadle, MD MPH

## 2015-06-18 NOTE — Telephone Encounter (Signed)
Pt is having issues with snoring and would like to have a referral for a sleep study we have not seen this patient for the problem before

## 2015-06-19 ENCOUNTER — Encounter: Payer: Self-pay | Admitting: Neurology

## 2015-06-19 ENCOUNTER — Encounter: Payer: Self-pay | Admitting: Gastroenterology

## 2015-06-19 ENCOUNTER — Ambulatory Visit (INDEPENDENT_AMBULATORY_CARE_PROVIDER_SITE_OTHER): Payer: Commercial Managed Care - HMO | Admitting: Neurology

## 2015-06-19 VITALS — BP 138/80 | HR 80 | Resp 18 | Ht 71.5 in | Wt 241.0 lb

## 2015-06-19 DIAGNOSIS — Z87898 Personal history of other specified conditions: Secondary | ICD-10-CM

## 2015-06-19 DIAGNOSIS — G4733 Obstructive sleep apnea (adult) (pediatric): Secondary | ICD-10-CM | POA: Diagnosis not present

## 2015-06-19 DIAGNOSIS — G4761 Periodic limb movement disorder: Secondary | ICD-10-CM | POA: Diagnosis not present

## 2015-06-19 DIAGNOSIS — R519 Headache, unspecified: Secondary | ICD-10-CM

## 2015-06-19 DIAGNOSIS — R51 Headache: Secondary | ICD-10-CM | POA: Diagnosis not present

## 2015-06-19 DIAGNOSIS — G4719 Other hypersomnia: Secondary | ICD-10-CM

## 2015-06-19 NOTE — Patient Instructions (Signed)

## 2015-06-19 NOTE — Progress Notes (Signed)
Subjective:    Patient ID: Michael Berger is a 54 y.o. male.  HPI     Star Age, MD, PhD [facility] [address] [place], [place] [address]  Dear Dr. Brigitte Pulse,   I saw your patient, Michael Berger, upon your kind request, in my neurologic clinic today for initial consultation of his sleep disorder, in particular, concern for underlying obstructive sleep apnea. The patient is unaccompanied today. As you know, Michael Berger is a 54 year old right-handed gentleman with an underlying medical history of hypertension, ED, and obesity, who reports snoring and excessive daytime somnolence and waking up with a sense of gagging and gasping.  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 He works 4 days on and 4 days off. He also works part-time during his 4 days off. He goes to bed around 9 PM but often falls asleep before than watching TV. He has come close to dozing off at the wheel but has never fallen asleep at the wheel. His rise time is 4:30 AM but sometimes he is already up around 3 or 3:30 AM and cannot go back to sleep. He denies any nocturia but has woken up with headaches. He has had morning headaches for the past several months. He has had loud snoring and abnormal noises while asleep. This has been going on for months or years. His wife has repeatedly asked him to be checked out for this. She often cannot sleep in the same bedroom with him. He has recently been started on testosterone replacement. He has a history of neck pain which has been going on for a few months. He saw orthopedics at [facility] for this. He had neck imaging testing which showed degenerative spine disease. He was referred to physical therapy and injection treatment was considered. He started going to physical therapy but then stopped going. He does admit that it was helpful and that his pain improved. He has some intermittent tingling in his hands bilaterally. He has no  frank pain. He denies restless leg symptoms but is known to twitch and kick in his sleep. His wife has reported that he kicks his leg and his sleep and he has woken himself up with leg jerking. His Epworth sleepiness score is 17 out of a maximum of 24, his fatigue score is 44 out of 63. He denies a family history of obstructive sleep apnea. He never smoked cigarettes but has rarely smoked cigars. He drinks alcohol occasionally and usually 1 cup of coffee per day on his days on. Very occasionally, a few times a month he works from 10 PM to 7 AM. He has gained weight in the past few years. He has one grown son age 21 who lives in [place], [place]. In the recent past he has had shortness of breath and chest tightness with minimal exertion. He was checked out for this by cardiology and saw Dr. Einar Gip, and test results were benign per patient.  His Past Medical History Is Significant For: Past Medical History  Diagnosis Date  . Anxiety   . Stress   . Hypertension   . GERD (gastroesophageal reflux disease)     His Past Surgical History Is Significant For: Past Surgical History  Procedure Laterality Date  . Appendectomy      Age 61    His Family History Is Significant For: Family History  Problem Relation Age of Onset  . Hypertension Mother     His  Social History Is Significant For: Social History   Social History  . Marital Status: Married    Spouse Name: Michael Berger  . Number of Children: 2  . Years of Education: Michael Berger   Occupational History  . law enforcement Unemployed    [place] History Main Topics  . Smoking status: Current Some Day Smoker    Types: Cigars  . Smokeless tobacco: Not on file     Comment: cigars  . Alcohol Use: 0.0 oz/week    0 Standard drinks or equivalent per week     Comment: Rare  . Drug Use: No  . Sexual Activity: Not on file   Other Topics Concern  . Not on file   Social History Narrative   Exercises 3 x's weekly.   Drinks coffee  about 8 times a month   His Allergies Are:  No Known Allergies:   His Current Medications Are:  Outpatient Encounter Prescriptions as of 06/19/2015  Medication Sig  . losartan-hydrochlorothiazide (HYZAAR) 100-12.5 MG per tablet Take 1 tablet by mouth daily.  Marland Kitchen ofloxacin (OCUFLOX) 0.3 % ophthalmic solution Place 1 drop into the left eye 4 (four) times daily.  Marland Kitchen omeprazole (PRILOSEC) 20 MG capsule Take 1 capsule (20 mg total) by mouth daily. PATIENT NEEDS OFFICE VISIT FOR ADDITIONAL REFILLS  . sildenafil (VIAGRA) 50 MG tablet Take 1 tablet (50 mg total) by mouth daily as needed for erectile dysfunction.   No facility-administered encounter medications on file as of 06/19/2015.  :  Review of Systems:  Out of a complete 14 point review of systems, all are reviewed and negative with the exception of these symptoms as listed below:   Review of Systems  Neurological:       No trouble falling but has trouble staying asleep, snoring, wakes up chocking or gasping for air, wakes up feeling tired, rarely takes naps, wakes up with headaches.     Objective:  Neurologic Exam  Physical Exam Physical Examination:   Filed Vitals:   06/19/15 1627  BP: 138/80  Pulse: 80  Resp: 18   General Examination: The patient is a very pleasant 54 y.o. male in no acute distress. He appears well-developed and well-nourished and well groomed.   HEENT: Normocephalic, atraumatic, pupils are equal, round and reactive to light and accommodation. Funduscopic exam is normal with sharp disc margins noted. Extraocular tracking is good without limitation to gaze excursion or nystagmus noted. Normal smooth pursuit is noted. Hearing is grossly intact. Tympanic membranes are clear bilaterally. Face is symmetric with normal facial animation and normal facial sensation. Speech is clear with no dysarthria noted. There is no hypophonia. There is no lip, neck/head, jaw or voice tremor. Neck is supple with full range of passive and  active motion. There are no carotid bruits on auscultation. Oropharynx exam reveals: mild mouth dryness, adequate dental hygiene and marked airway crowding, due to narrow airway entry, larger uvula, slightly swollen uvula, and large tonsils, 2-3+ bilaterally, right side a little bigger than left. Mallampati is class II. Tongue protrudes centrally and palate elevates symmetrically. Neck size is 19 1/8 inches. He has a Mild overbite. Nasal inspection reveals no significant nasal mucosal bogginess or redness and no septal deviation.   Chest: Clear to auscultation without wheezing, rhonchi or crackles noted.  Heart: S1+S2+0, regular and normal without murmurs, rubs or gallops noted.   Abdomen: Soft, non-tender and non-distended with normal bowel sounds appreciated on auscultation.  Extremities: There is no pitting edema  in the distal lower extremities bilaterally. Pedal pulses are intact.  Skin: Warm and dry without trophic changes noted. There are no varicose veins.  Musculoskeletal: exam reveals no obvious joint deformities, tenderness or joint swelling or erythema.   Neurologically:  Mental status: The patient is awake, alert and oriented in all 4 spheres. His immediate and remote memory, attention, language skills and fund of knowledge are appropriate. There is no evidence of aphasia, agnosia, apraxia or anomia. Speech is clear with normal prosody and enunciation. Thought process is linear. Mood is normal and affect is normal.  Cranial nerves II - XII are as described above under HEENT exam. In addition: shoulder shrug is normal with equal shoulder height noted. Motor exam: Normal bulk, strength and tone is noted. There is no drift, tremor or rebound. Romberg is negative. Reflexes are 2+ throughout. Babinski: Toes are flexor bilaterally. Fine motor skills and coordination: intact with normal finger taps, normal hand movements, normal rapid alternating patting, normal foot taps and normal foot  agility.  Cerebellar testing: No dysmetria or intention tremor on finger to nose testing. Heel to shin is unremarkable bilaterally. There is no truncal or gait ataxia.  Sensory exam: intact to light touch, pinprick, vibration, temperature sense in the upper and lower extremities.  Gait, station and balance: He stands easily. No veering to one side is noted. No leaning to one side is noted. Posture is age-appropriate and stance is narrow based. Gait shows normal stride length and normal pace. No problems turning are noted. He turns en bloc. Tandem walk is unremarkable.   Assessment and Plan:   In summary, Lerone Onder is a very pleasant 54 y.o.-year old male hypertension, ED, history of chest pain, history of shortness of breath, and obesity, whose history and physical exam are in keeping with obstructive sleep apnea (OSA). He reports significant excessive daytime somnolence, morning headaches, and also leg jerking and twitching in his sleep, in keeping with PLMD. I had a long chat with the patient about my findings and the diagnosis of OSA, its prognosis and treatment options. We talked about medical treatments, surgical interventions and non-pharmacological approaches. I explained in particular the risks and ramifications of untreated moderate to severe OSA, especially with respect to developing cardiovascular disease down the Road, including congestive heart failure, difficult to treat hypertension, cardiac arrhythmias, or stroke. Even type 2 diabetes has, in part, been linked to untreated OSA. Symptoms of untreated OSA include daytime sleepiness, memory problems, mood irritability and mood disorder such as depression and anxiety, lack of energy, as well as recurrent headaches, especially morning headaches. We talked about trying to maintain a healthy lifestyle in general, as well as the importance of weight control. I encouraged the patient to eat healthy, exercise daily and keep well hydrated, to keep a  scheduled bedtime and wake time routine, to not skip any meals and eat healthy snacks in between meals. I advised the patient not to drive when feeling sleepy. I recommended the following at this time: sleep study with potential positive airway pressure titration. (We will score hypopneas at 4% and split the sleep study into diagnostic and treatment portion, if the estimated. 2 hour AHI is >20/h).   I explained the sleep test procedure to the patient and also outlined possible surgical and non-surgical treatment options of OSA, including the use of a custom-made dental device (which would require a referral to a specialist dentist or oral surgeon), upper airway surgical options, such as pillar implants, radiofrequency surgery, tongue  base surgery, and UPPP (which would involve a referral to an ENT surgeon). Rarely, jaw surgery such as mandibular advancement may be considered.  I also explained the CPAP treatment option to the patient, who indicated that he would be willing to try CPAP if the need arises. I explained the importance of being compliant with PAP treatment, not only for insurance purposes but primarily to improve His symptoms, and for the patient's long term health benefit, including to reduce His cardiovascular risks. I answered all his questions today and the patient was in agreement. I would like to see him back after the sleep study is completed and encouraged him to call with any interim questions, concerns, problems or updates.   Thank you very much for allowing me to participate in the care of this nice patient. If I can be of any further assistance to you please do not hesitate to call me at [phone].  Sincerely,   Star Age, MD, PhD

## 2015-07-20 ENCOUNTER — Ambulatory Visit (INDEPENDENT_AMBULATORY_CARE_PROVIDER_SITE_OTHER): Payer: Commercial Managed Care - HMO | Admitting: Neurology

## 2015-07-20 DIAGNOSIS — G479 Sleep disorder, unspecified: Secondary | ICD-10-CM

## 2015-07-20 DIAGNOSIS — G4761 Periodic limb movement disorder: Secondary | ICD-10-CM

## 2015-07-20 DIAGNOSIS — G4733 Obstructive sleep apnea (adult) (pediatric): Secondary | ICD-10-CM

## 2015-07-21 NOTE — Sleep Study (Signed)
Please see the scanned sleep study interpretation located in the Procedure tab within the Chart Review section. 

## 2015-07-25 ENCOUNTER — Telehealth: Payer: Self-pay | Admitting: Neurology

## 2015-07-25 DIAGNOSIS — G4733 Obstructive sleep apnea (adult) (pediatric): Secondary | ICD-10-CM

## 2015-07-25 NOTE — Telephone Encounter (Signed)
Diana:  Patient referred by Dr. Brigitte Pulse, seen by me on 06/19/15, split study on 07/20/15, Ins: [employer] Please call and notify patient that the recent sleep study confirmed the diagnosis of moderate OSA. He did well with CPAP during the study with significant improvement of the respiratory events. Therefore, I would like start the patient on CPAP therapy at home by prescribing a machine for home use. I placed the order in the chart. The patient will need a follow up appointment with me in 8 to 10 weeks post set up that has to be scheduled; please go ahead and schedule while you have the patient on the phone and make sure patient understands the importance of keeping this window for the FU appointment, as it is often an insurance requirement and failing to adhere to this may result in losing coverage for sleep apnea treatment. Please re-enforce the importance of compliance with treatment and the need for [place] to monitor compliance data - again an insurance requirement and good feedback for the patient as far as how they are doing.  Also remind patient, that any upcoming CPAP machine or mask issues, should be first addressed with the DME company. Please ask if patient has a preference regarding DME company.  Please arrange for CPAP set up at home through a DME company of patient's choice - once you have spoken to the patient - and faxed/routed report to PCP and referring MD (if other than PCP), you can close this encounter, thanks,   Star Age, MD, PhD [facility] ([place])

## 2015-07-28 NOTE — Telephone Encounter (Signed)
Pt called returning Diana's call.

## 2015-07-28 NOTE — Telephone Encounter (Signed)
No answer, tried to leave vm but vm was full. I will try again later.

## 2015-07-28 NOTE — Telephone Encounter (Signed)
I spoke to patient and he is aware of results and recommendation. He is willing to proceed with treatment. I will refer to [facility], patient is in agreement. I will sent letter to patient, reminding him to make f/u appt and stress the importance of compliance. I will send report to PCP.

## 2015-08-08 ENCOUNTER — Ambulatory Visit (AMBULATORY_SURGERY_CENTER): Payer: Self-pay

## 2015-08-08 VITALS — Ht 71.5 in | Wt 241.2 lb

## 2015-08-08 DIAGNOSIS — Z1211 Encounter for screening for malignant neoplasm of colon: Secondary | ICD-10-CM

## 2015-08-08 MED ORDER — SUPREP BOWEL PREP KIT 17.5-3.13-1.6 GM/177ML PO SOLN: 1.0000 | Freq: Once | ORAL | Status: DC

## 2015-08-08 NOTE — Progress Notes (Signed)
No allergies to eggs or soy No diet/weight loss meds No past problems with anesthesia No home oxygen  Has internet on phone; refused emmi

## 2015-08-09 ENCOUNTER — Telehealth: Payer: Self-pay | Admitting: Family Medicine

## 2015-08-09 NOTE — Telephone Encounter (Signed)
Mailbox full i tried to call patient to set up another appt that he had with Brigitte Pulse in 10/16/15 for a CPE that date has been cancelled

## 2015-08-14 NOTE — Telephone Encounter (Signed)
Patient called to advise he has never heard from anyone regarding CPAP machine and supplies. Patient says he never received letter that our office mailed to him dated 07/29/15. Patient wants to know who made the decision to choose [facility], are they a good company, why haven't they called him etc. Please call patient [phone].

## 2015-08-14 NOTE — Telephone Encounter (Signed)
[facility] contacted me today via email. They state that they have talked to patient and he seems to be satisfied.

## 2015-08-14 NOTE — Telephone Encounter (Signed)
I spoke to patient. Advised him that we have been in contact with [facility] and have confirmed orders. I stated that I will reach out to our contact at [facility] today (just sent email). I will call him back when I get a response.

## 2015-08-19 ENCOUNTER — Encounter: Payer: Self-pay | Admitting: Gastroenterology

## 2015-08-22 ENCOUNTER — Encounter: Payer: Self-pay | Admitting: Gastroenterology

## 2015-08-26 ENCOUNTER — Ambulatory Visit (AMBULATORY_SURGERY_CENTER): Payer: Commercial Managed Care - HMO | Admitting: Gastroenterology

## 2015-08-26 ENCOUNTER — Encounter: Payer: Self-pay | Admitting: Gastroenterology

## 2015-08-26 VITALS — BP 126/88 | HR 87 | Temp 98.1°F | Resp 15 | Ht 71.5 in | Wt 241.0 lb

## 2015-08-26 DIAGNOSIS — Z1211 Encounter for screening for malignant neoplasm of colon: Secondary | ICD-10-CM | POA: Diagnosis not present

## 2015-08-26 DIAGNOSIS — D122 Benign neoplasm of ascending colon: Secondary | ICD-10-CM | POA: Diagnosis not present

## 2015-08-26 DIAGNOSIS — D123 Benign neoplasm of transverse colon: Secondary | ICD-10-CM | POA: Diagnosis not present

## 2015-08-26 DIAGNOSIS — D125 Benign neoplasm of sigmoid colon: Secondary | ICD-10-CM | POA: Diagnosis not present

## 2015-08-26 DIAGNOSIS — D124 Benign neoplasm of descending colon: Secondary | ICD-10-CM

## 2015-08-26 MED ORDER — SODIUM CHLORIDE 0.9 % IV SOLN: 500.0000 mL | INTRAVENOUS | Status: DC

## 2015-08-26 NOTE — Progress Notes (Signed)
Patient awakening,vss,report to rn 

## 2015-08-26 NOTE — Progress Notes (Signed)
Called to room to assist during endoscopic procedure.  Patient ID and intended procedure confirmed with present staff. Received instructions for my participation in the procedure from the performing physician.  

## 2015-08-26 NOTE — Patient Instructions (Signed)
YOU HAD AN ENDOSCOPIC PROCEDURE TODAY AT [facility]:   Refer to the procedure report that was given to you for any specific questions about what was found during the examination.  If the procedure report does not answer your questions, please call your gastroenterologist to clarify.  If you requested that your care partner not be given the details of your procedure findings, then the procedure report has been included in a sealed envelope for you to review at your convenience later.  YOU SHOULD EXPECT: Some feelings of bloating in the abdomen. Passage of more gas than usual.  Walking can help get rid of the air that was put into your GI tract during the procedure and reduce the bloating. If you had a lower endoscopy (such as a colonoscopy or flexible sigmoidoscopy) you may notice spotting of blood in your stool or on the toilet paper. If you underwent a bowel prep for your procedure, you may not have a normal bowel movement for a few days.  Please Note:  You might notice some irritation and congestion in your nose or some drainage.  This is from the oxygen used during your procedure.  There is no need for concern and it should clear up in a day or so.  SYMPTOMS TO REPORT IMMEDIATELY:   Following lower endoscopy (colonoscopy or flexible sigmoidoscopy):  Excessive amounts of blood in the stool  Significant tenderness or worsening of abdominal pains  Swelling of the abdomen that is new, acute  Fever of 100F or higher   For urgent or emergent issues, a gastroenterologist can be reached at any hour by calling [phone].   DIET: Your first meal following the procedure should be a small meal and then it is ok to progress to your normal diet. Heavy or fried foods are harder to digest and may make you feel nauseous or bloated.  Likewise, meals heavy in dairy and vegetables can increase bloating.  Drink plenty of fluids but you should avoid alcoholic beverages for 24  hours.  ACTIVITY:  You should plan to take it easy for the rest of today and you should NOT DRIVE or use heavy machinery until tomorrow (because of the sedation medicines used during the test).    FOLLOW UP: Our staff will call the number listed on your records the next business day following your procedure to check on you and address any questions or concerns that you may have regarding the information given to you following your procedure. If we do not reach you, we will leave a message.  However, if you are feeling well and you are not experiencing any problems, there is no need to return our call.  We will assume that you have returned to your regular daily activities without incident.  If any biopsies were taken you will be contacted by phone or by letter within the next 1-3 weeks.  Please call us at [phone] if you have not heard about the biopsies in 3 weeks.    SIGNATURES/CONFIDENTIALITY: You and/or your care partner have signed paperwork which will be entered into your electronic medical record.  These signatures attest to the fact that that the information above on your After Visit Summary has been reviewed and is understood.  Full responsibility of the confidentiality of this discharge information lies with you and/or your care-partner.  Please review polyp handout provieded. Next colonoscopy determined by pathology results.

## 2015-08-26 NOTE — Op Note (Signed)
[place]  [address] [place], [address]   COLONOSCOPY PROCEDURE REPORT  PATIENT: Michael, Berger  MR#: [id number] BIRTHDATE: 03/18/1961 , 63  yrs. old GENDER: male ENDOSCOPIST: Milus Banister, MD REFERRED Katherina Mires, M.D. PROCEDURE DATE:  08/26/2015 PROCEDURE:   Colonoscopy, screening and Colonoscopy with snare polypectomy First Screening Colonoscopy - Avg.  risk and is 50 yrs.  old or older Yes.  Prior Negative Screening - Now for repeat screening. N/A  History of Adenoma - Now for follow-up colonoscopy & has been > or = to 3 yrs.  N/A  Polyps removed today? Yes ASA CLASS:   Class II INDICATIONS:Screening for colonic neoplasia and Colorectal Neoplasm Risk Assessment for this procedure is average risk. MEDICATIONS: Monitored anesthesia care and Propofol 300 mg IV  DESCRIPTION OF PROCEDURE:   After the risks benefits and alternatives of the procedure were thoroughly explained, informed consent was obtained.  The digital rectal exam revealed no abnormalities of the rectum.   The LB [id number]  endoscope was introduced through the anus and advanced to the cecum, which was identified by both the appendix and ileocecal valve. No adverse events experienced.   The quality of the prep was excellent.  The instrument was then slowly withdrawn as the colon was fully examined. Estimated blood loss is zero unless otherwise noted in this procedure report.   COLON FINDINGS: Three sessile polyps ranging between 3-45mm in size were found in the sigmoid colon, transverse colon, and ascending colon.  Polypectomies were performed with a cold snare.  The resection was complete, the polyp tissue was partially retrieved and sent to histology (all polyps were removed, 2 were retrieved, they were all clearly adenomatous polyps endoscopically).   The examination was otherwise normal.  Retroflexed views revealed no abnormalities. The time to cecum = 2.3 Withdrawal time =  10.5   The scope was withdrawn and the procedure completed. COMPLICATIONS: There were no immediate complications.  ENDOSCOPIC IMPRESSION: 1.   Three sessile polyps ranging between 3-7mm in size were found in the sigmoid colon, transverse colon, and ascending colon; polypectomies were performed with a cold snare 2.   The examination was otherwise normal  RECOMMENDATIONS: If the polyp(s) removed today are proven to be adenomatous (pre-cancerous) polyps, you will need a colonoscopy in 3 years. Otherwise you should continue to follow colorectal cancer screening guidelines for "routine risk" patients with a colonoscopy in 10 years.  You will receive a letter within 1-2 weeks with the results of your biopsy as well as final recommendations.  Please call my office if you have not received a letter after 3 weeks.  eSigned:  Milus Banister, MD 08/26/2015 10:27 AM

## 2015-08-27 ENCOUNTER — Telehealth: Payer: Self-pay | Admitting: *Deleted

## 2015-08-27 NOTE — Telephone Encounter (Signed)
No answer, message left for the patient. 

## 2015-09-07 ENCOUNTER — Encounter: Payer: Self-pay | Admitting: Gastroenterology

## 2015-10-16 ENCOUNTER — Encounter: Payer: Commercial Managed Care - HMO | Admitting: Family Medicine

## 2015-12-02 ENCOUNTER — Encounter: Payer: Commercial Managed Care - HMO | Admitting: Family Medicine

## 2015-12-02 ENCOUNTER — Encounter: Payer: Self-pay | Admitting: Family Medicine

## 2015-12-02 ENCOUNTER — Ambulatory Visit (INDEPENDENT_AMBULATORY_CARE_PROVIDER_SITE_OTHER): Payer: Commercial Managed Care - HMO | Admitting: Family Medicine

## 2015-12-02 VITALS — BP 140/94 | HR 83 | Temp 97.9°F | Resp 16 | Ht 71.5 in | Wt 238.0 lb

## 2015-12-02 DIAGNOSIS — Z1383 Encounter for screening for respiratory disorder NEC: Secondary | ICD-10-CM

## 2015-12-02 DIAGNOSIS — I10 Essential (primary) hypertension: Secondary | ICD-10-CM

## 2015-12-02 DIAGNOSIS — K219 Gastro-esophageal reflux disease without esophagitis: Secondary | ICD-10-CM | POA: Diagnosis not present

## 2015-12-02 DIAGNOSIS — Z130 Encounter for screening for diseases of the blood and blood-forming organs and certain disorders involving the immune mechanism: Secondary | ICD-10-CM | POA: Diagnosis not present

## 2015-12-02 DIAGNOSIS — Z1389 Encounter for screening for other disorder: Secondary | ICD-10-CM

## 2015-12-02 DIAGNOSIS — M5137 Other intervertebral disc degeneration, lumbosacral region: Secondary | ICD-10-CM | POA: Insufficient documentation

## 2015-12-02 DIAGNOSIS — Z5181 Encounter for therapeutic drug level monitoring: Secondary | ICD-10-CM | POA: Diagnosis not present

## 2015-12-02 DIAGNOSIS — M51379 Other intervertebral disc degeneration, lumbosacral region without mention of lumbar back pain or lower extremity pain: Secondary | ICD-10-CM | POA: Insufficient documentation

## 2015-12-02 DIAGNOSIS — Z136 Encounter for screening for cardiovascular disorders: Secondary | ICD-10-CM

## 2015-12-02 DIAGNOSIS — Z125 Encounter for screening for malignant neoplasm of prostate: Secondary | ICD-10-CM

## 2015-12-02 DIAGNOSIS — G4733 Obstructive sleep apnea (adult) (pediatric): Secondary | ICD-10-CM | POA: Insufficient documentation

## 2015-12-02 DIAGNOSIS — N528 Other male erectile dysfunction: Secondary | ICD-10-CM

## 2015-12-02 DIAGNOSIS — E669 Obesity, unspecified: Secondary | ICD-10-CM | POA: Diagnosis not present

## 2015-12-02 DIAGNOSIS — Z0000 Encounter for general adult medical examination without abnormal findings: Secondary | ICD-10-CM

## 2015-12-02 DIAGNOSIS — Z1329 Encounter for screening for other suspected endocrine disorder: Secondary | ICD-10-CM | POA: Diagnosis not present

## 2015-12-02 DIAGNOSIS — Z113 Encounter for screening for infections with a predominantly sexual mode of transmission: Secondary | ICD-10-CM

## 2015-12-02 DIAGNOSIS — Z9989 Dependence on other enabling machines and devices: Secondary | ICD-10-CM

## 2015-12-02 DIAGNOSIS — M5030 Other cervical disc degeneration, unspecified cervical region: Secondary | ICD-10-CM | POA: Insufficient documentation

## 2015-12-02 DIAGNOSIS — J301 Allergic rhinitis due to pollen: Secondary | ICD-10-CM | POA: Diagnosis not present

## 2015-12-02 LAB — POCT URINALYSIS DIP (MANUAL ENTRY)
Bilirubin, UA: NEGATIVE
Blood, UA: NEGATIVE
Glucose, UA: NEGATIVE
Ketones, POC UA: NEGATIVE
Leukocytes, UA: NEGATIVE
Nitrite, UA: NEGATIVE
Protein Ur, POC: NEGATIVE
Spec Grav, UA: 1.02
Urobilinogen, UA: 0.2
pH, UA: 5.5

## 2015-12-02 MED ORDER — FLUTICASONE PROPIONATE 50 MCG/ACT NA SUSP: 2.0000 | Freq: Every day | NASAL | Status: DC

## 2015-12-02 MED ORDER — TADALAFIL 20 MG PO TABS: ORAL_TABLET | ORAL | Status: DC

## 2015-12-02 MED ORDER — OMEPRAZOLE 20 MG PO CPDR: 20.0000 mg | DELAYED_RELEASE_CAPSULE | Freq: Every day | ORAL | Status: DC

## 2015-12-02 MED ORDER — CETIRIZINE HCL 10 MG PO TABS: 10.0000 mg | ORAL_TABLET | Freq: Every day | ORAL | Status: DC

## 2015-12-02 MED ORDER — LOSARTAN POTASSIUM-HCTZ 100-25 MG PO TABS: 1.0000 | ORAL_TABLET | Freq: Every day | ORAL | Status: DC

## 2015-12-02 NOTE — Patient Instructions (Signed)
You should stay away from all cough and cold medications containing decongestant, especially phenylephrine and pseudoephedrine (will be listed under the active ingredient list).  To make it easier, CoricidinHBP is a product tailored towards people with hypertension. Check your blood pressure twice a week and call in several weeks to let me know what it is ranging from so we can decide if we need to increase your blood pressure medicine.  DASH Eating Plan DASH stands for "Dietary Approaches to Stop Hypertension." The DASH eating plan is a healthy eating plan that has been shown to reduce high blood pressure (hypertension). Additional health benefits may include reducing the risk of type 2 diabetes mellitus, heart disease, and stroke. The DASH eating plan may also help with weight loss. WHAT DO I NEED TO KNOW ABOUT THE DASH EATING PLAN? For the DASH eating plan, you will follow these general guidelines:  Choose foods with a percent daily value for sodium of less than 5% (as listed on the food label).  Use salt-free seasonings or herbs instead of table salt or sea salt.  Check with your health care provider or pharmacist before using salt substitutes.  Eat lower-sodium products, often labeled as "lower sodium" or "no salt added."  Eat fresh foods.  Eat more vegetables, fruits, and low-fat dairy products.  Choose whole grains. Look for the word "whole" as the first word in the ingredient list.  Choose fish and skinless chicken or [place] more often than red meat. Limit fish, poultry, and meat to 6 oz (170 g) each day.  Limit sweets, desserts, sugars, and sugary drinks.  Choose heart-healthy fats.  Limit cheese to 1 oz (28 g) per day.  Eat more home-cooked food and less restaurant, buffet, and fast food.  Limit fried foods.  Cook foods using methods other than frying.  Limit canned vegetables. If you do use them, rinse them well to decrease the sodium.  When eating at a restaurant,  ask that your food be prepared with less salt, or no salt if possible. WHAT FOODS CAN I EAT? Seek help from a dietitian for individual calorie needs. Grains Whole grain or whole wheat bread. Brown rice. Whole grain or whole wheat pasta. Quinoa, bulgur, and whole grain cereals. Low-sodium cereals. Corn or whole wheat flour tortillas. Whole grain cornbread. Whole grain crackers. Low-sodium crackers. Vegetables Fresh or frozen vegetables (raw, steamed, roasted, or grilled). Low-sodium or reduced-sodium tomato and vegetable juices. Low-sodium or reduced-sodium tomato sauce and paste. Low-sodium or reduced-sodium canned vegetables.  Fruits All fresh, canned (in natural juice), or frozen fruits. Meat and Other Protein Products Ground beef (85% or leaner), grass-fed beef, or beef trimmed of fat. Skinless chicken or [place]. Ground chicken or [place]. Pork trimmed of fat. All fish and seafood. Eggs. Dried beans, peas, or lentils. Unsalted nuts and seeds. Unsalted canned beans. Dairy Low-fat dairy products, such as skim or 1% milk, 2% or reduced-fat cheeses, low-fat ricotta or cottage cheese, or plain low-fat yogurt. Low-sodium or reduced-sodium cheeses. Fats and Oils Tub margarines without trans fats. Light or reduced-fat mayonnaise and salad dressings (reduced sodium). Avocado. Safflower, olive, or canola oils. Natural peanut or almond butter. Other Unsalted popcorn and pretzels. The items listed above may not be a complete list of recommended foods or beverages. Contact your dietitian for more options. WHAT FOODS ARE NOT RECOMMENDED? Grains White bread. White pasta. White rice. Refined cornbread. Bagels and croissants. Crackers that contain trans fat. Vegetables Creamed or fried vegetables. Vegetables in a cheese sauce. Regular  canned vegetables. Regular canned tomato sauce and paste. Regular tomato and vegetable juices. Fruits Dried fruits. Canned fruit in light or heavy syrup. Fruit juice. Meat  and Other Protein Products Fatty cuts of meat. Ribs, chicken wings, bacon, sausage, bologna, salami, chitterlings, fatback, hot dogs, bratwurst, and packaged luncheon meats. Salted nuts and seeds. Canned beans with salt. Dairy Whole or 2% milk, cream, half-and-half, and cream cheese. Whole-fat or sweetened yogurt. Full-fat cheeses or blue cheese. Nondairy creamers and whipped toppings. Processed cheese, cheese spreads, or cheese curds. Condiments Onion and garlic salt, seasoned salt, table salt, and sea salt. Canned and packaged gravies. Worcestershire sauce. Tartar sauce. Barbecue sauce. Teriyaki sauce. Soy sauce, including reduced sodium. Steak sauce. Fish sauce. Oyster sauce. Cocktail sauce. Horseradish. Ketchup and mustard. Meat flavorings and tenderizers. Bouillon cubes. Hot sauce. Tabasco sauce. Marinades. Taco seasonings. Relishes. Fats and Oils Butter, stick margarine, lard, shortening, ghee, and bacon fat. Coconut, palm kernel, or palm oils. Regular salad dressings. Other Pickles and olives. Salted popcorn and pretzels. The items listed above may not be a complete list of foods and beverages to avoid. Contact your dietitian for more information. WHERE CAN I FIND MORE INFORMATION? National Heart, Lung, and Blood Institute: travelstabloid.com   This information is not intended to replace advice given to you by your health care provider. Make sure you discuss any questions you have with your health care provider.   Document Released: 09/16/2011 Document Revised: 10/18/2014 Document Reviewed: 08/01/2013 Elsevier Interactive Patient Education 2016 [place] Your High Blood Pressure Blood pressure is a measurement of how forceful your blood is pressing against the walls of the arteries. Arteries are muscular tubes within the circulatory system. Blood pressure does not stay the same. Blood pressure rises when you are active, excited, or nervous; and  it lowers during sleep and relaxation. If the numbers measuring your blood pressure stay above normal most of the time, you are at risk for health problems. High blood pressure (hypertension) is a long-term (chronic) condition in which blood pressure is elevated. A blood pressure reading is recorded as two numbers, such as 120 over 80 (or 120/80). The first, higher number is called the systolic pressure. It is a measure of the pressure in your arteries as the heart beats. The second, lower number is called the diastolic pressure. It is a measure of the pressure in your arteries as the heart relaxes between beats.  Keeping your blood pressure in a normal range is important to your overall health and prevention of health problems, such as heart disease and stroke. When your blood pressure is uncontrolled, your heart has to work harder than normal. High blood pressure is a very common condition in adults because blood pressure tends to rise with age. Men and women are equally likely to have hypertension but at different times in life. Before age 5, men are more likely to have hypertension. After 53 years of age, women are more likely to have it. Hypertension is especially common in African Americans. This condition often has no signs or symptoms. The cause of the condition is usually not known. Your caregiver can help you come up with a plan to keep your blood pressure in a normal, healthy range. BLOOD PRESSURE STAGES Blood pressure is classified into four stages: normal, prehypertension, stage 1, and stage 2. Your blood pressure reading will be used to determine what type of treatment, if any, is necessary. Appropriate treatment options are tied to these four stages:  Normal  Systolic  pressure (mm Hg): below 120.  Diastolic pressure (mm Hg): below 80. Prehypertension  Systolic pressure (mm Hg): 120 to 139.  Diastolic pressure (mm Hg): 80 to 89. Stage1  Systolic pressure (mm Hg): 140 to  159.  Diastolic pressure (mm Hg): 90 to 99. Stage2  Systolic pressure (mm Hg): 160 or above.  Diastolic pressure (mm Hg): 100 or above. RISKS RELATED TO HIGH BLOOD PRESSURE Managing your blood pressure is an important responsibility. Uncontrolled high blood pressure can lead to:  A heart attack.  A stroke.  A weakened blood vessel (aneurysm).  Heart failure.  Kidney damage.  Eye damage.  Metabolic syndrome.  Memory and concentration problems. HOW TO MANAGE YOUR BLOOD PRESSURE Blood pressure can be managed effectively with lifestyle changes and medicines (if needed). Your caregiver will help you come up with a plan to bring your blood pressure within a normal range. Your plan should include the following: Education  Read all information provided by your caregivers about how to control blood pressure.  Educate yourself on the latest guidelines and treatment recommendations. New research is always being done to further define the risks and treatments for high blood pressure. Lifestylechanges  Control your weight.  Avoid smoking.  Stay physically active.  Reduce the amount of salt in your diet.  Reduce stress.  Control any chronic conditions, such as high cholesterol or diabetes.  Reduce your alcohol intake. Medicines  Several medicines (antihypertensive medicines) are available, if needed, to bring blood pressure within a normal range. Communication  Review all the medicines you take with your caregiver because there may be side effects or interactions.  Talk with your caregiver about your diet, exercise habits, and other lifestyle factors that may be contributing to high blood pressure.  See your caregiver regularly. Your caregiver can help you create and adjust your plan for managing high blood pressure. RECOMMENDATIONS FOR TREATMENT AND FOLLOW-UP  The following recommendations are based on current guidelines for managing high blood pressure in nonpregnant  adults. Use these recommendations to identify the proper follow-up period or treatment option based on your blood pressure reading. You can discuss these options with your caregiver.  Systolic pressure of 123456 to [id number] or diastolic pressure of 80 to 89: Follow up with your caregiver as directed.  Systolic pressure of [id number] to 0000000 or diastolic pressure of 90 to 100: Follow up with your caregiver within 2 months.  Systolic pressure above 0000000 or diastolic pressure above [id number]: Follow up with your caregiver within 1 month.  Systolic pressure above [id number] or diastolic pressure above [id number]: Consider antihypertensive therapy; follow up with your caregiver within 1 week.  Systolic pressure above [id number] or diastolic pressure above 123456: Begin antihypertensive therapy; follow up with your caregiver within 1 week.   This information is not intended to replace advice given to you by your health care provider. Make sure you discuss any questions you have with your health care provider.   Document Released: 06/21/2012 Document Reviewed: 06/21/2012 Elsevier Interactive Patient Education 2016 [place] Maintenance, Male A healthy lifestyle and preventative care can promote health and wellness.  Maintain regular health, dental, and eye exams.  Eat a healthy diet. Foods like vegetables, fruits, whole grains, low-fat dairy products, and lean protein foods contain the nutrients you need and are low in calories. Decrease your intake of foods high in solid fats, added sugars, and salt. Get information about a proper diet from your health care provider, if necessary.  Regular physical exercise is one  of the most important things you can do for your health. Most adults should get at least 150 minutes of moderate-intensity exercise (any activity that increases your heart rate and causes you to sweat) each week. In addition, most adults need muscle-strengthening exercises on 2 or more days a week.   Maintain a healthy  weight. The body mass index (BMI) is a screening tool to identify possible weight problems. It provides an estimate of body fat based on height and weight. Your health care provider can find your BMI and can help you achieve or maintain a healthy weight. For males 20 years and older:  A BMI below 18.5 is considered underweight.  A BMI of 18.5 to 24.9 is normal.  A BMI of 25 to 29.9 is considered overweight.  A BMI of 30 and above is considered obese.  Maintain normal blood lipids and cholesterol by exercising and minimizing your intake of saturated fat. Eat a balanced diet with plenty of fruits and vegetables. Blood tests for lipids and cholesterol should begin at age 21 and be repeated every 5 years. If your lipid or cholesterol levels are high, you are over age 53, or you are at high risk for heart disease, you may need your cholesterol levels checked more frequently.Ongoing high lipid and cholesterol levels should be treated with medicines if diet and exercise are not working.  If you smoke, find out from your health care provider how to quit. If you do not use tobacco, do not start.  Lung cancer screening is recommended for adults aged 37-80 years who are at high risk for developing lung cancer because of a history of smoking. A yearly low-dose CT scan of the lungs is recommended for people who have at least a 30-pack-year history of smoking and are current smokers or have quit within the past 15 years. A pack year of smoking is smoking an average of 1 pack of cigarettes a day for 1 year (for example, a 30-pack-year history of smoking could mean smoking 1 pack a day for 30 years or 2 packs a day for 15 years). Yearly screening should continue until the smoker has stopped smoking for at least 15 years. Yearly screening should be stopped for people who develop a health problem that would prevent them from having lung cancer treatment.  If you choose to drink alcohol, do not have more than 2 drinks  per day. One drink is considered to be 12 oz (360 mL) of beer, 5 oz (150 mL) of wine, or 1.5 oz (45 mL) of liquor.  Avoid the use of street drugs. Do not share needles with anyone. Ask for help if you need support or instructions about stopping the use of drugs.  High blood pressure causes heart disease and increases the risk of stroke. High blood pressure is more likely to develop in:  People who have blood pressure in the end of the normal range (100-139/85-89 mm Hg).  People who are overweight or obese.  People who are African American.  If you are 34-50 years of age, have your blood pressure checked every 3-5 years. If you are 34 years of age or older, have your blood pressure checked every year. You should have your blood pressure measured twice--once when you are at a hospital or clinic, and once when you are not at a hospital or clinic. Record the average of the two measurements. To check your blood pressure when you are not at a hospital or clinic, you can  use:  An automated blood pressure machine at a pharmacy.  A home blood pressure monitor.  If you are 51-58 years old, ask your health care provider if you should take aspirin to prevent heart disease.  Diabetes screening involves taking a blood sample to check your fasting blood sugar level. This should be done once every 3 years after age 72 if you are at a normal weight and without risk factors for diabetes. Testing should be considered at a younger age or be carried out more frequently if you are overweight and have at least 1 risk factor for diabetes.  Colorectal cancer can be detected and often prevented. Most routine colorectal cancer screening begins at the age of 80 and continues through age 37. However, your health care provider may recommend screening at an earlier age if you have risk factors for colon cancer. On a yearly basis, your health care provider may provide home test kits to check for hidden blood in the stool. A  small camera at the end of a tube may be used to directly examine the colon (sigmoidoscopy or colonoscopy) to detect the earliest forms of colorectal cancer. Talk to your health care provider about this at age 64 when routine screening begins. A direct exam of the colon should be repeated every 5-10 years through age 58, unless early forms of precancerous polyps or small growths are found.  People who are at an increased risk for hepatitis B should be screened for this virus. You are considered at high risk for hepatitis B if:  You were born in a country where hepatitis B occurs often. Talk with your health care provider about which countries are considered high risk.  Your parents were born in a high-risk country and you have not received a shot to protect against hepatitis B (hepatitis B vaccine).  You have HIV or AIDS.  You use needles to inject street drugs.  You live with, or have sex with, someone who has hepatitis B.  You are a man who has sex with other men (MSM).  You get hemodialysis treatment.  You take certain medicines for conditions like cancer, organ transplantation, and autoimmune conditions.  Hepatitis C blood testing is recommended for all people born from [birth year] through 1963 and any individual with known risk factors for hepatitis C.  Healthy men should no longer receive prostate-specific antigen (PSA) blood tests as part of routine cancer screening. Talk to your health care provider about prostate cancer screening.  Testicular cancer screening is not recommended for adolescents or adult males who have no symptoms. Screening includes self-exam, a health care provider exam, and other screening tests. Consult with your health care provider about any symptoms you have or any concerns you have about testicular cancer.  Practice safe sex. Use condoms and avoid high-risk sexual practices to reduce the spread of sexually transmitted infections (STIs).  You should be screened for  STIs, including gonorrhea and chlamydia if:  You are sexually active and are younger than 24 years.  You are older than 24 years, and your health care provider tells you that you are at risk for this type of infection.  Your sexual activity has changed since you were last screened, and you are at an increased risk for chlamydia or gonorrhea. Ask your health care provider if you are at risk.  If you are at risk of being infected with HIV, it is recommended that you take a prescription medicine daily to prevent HIV infection. This is  called pre-exposure prophylaxis (PrEP). You are considered at risk if:  You are a man who has sex with other men (MSM).  You are a heterosexual man who is sexually active with multiple partners.  You take drugs by injection.  You are sexually active with a partner who has HIV.  Talk with your health care provider about whether you are at high risk of being infected with HIV. If you choose to begin PrEP, you should first be tested for HIV. You should then be tested every 3 months for as long as you are taking PrEP.  Use sunscreen. Apply sunscreen liberally and repeatedly throughout the day. You should seek shade when your shadow is shorter than you. Protect yourself by wearing long sleeves, pants, a wide-brimmed hat, and sunglasses year round whenever you are outdoors.  Tell your health care provider of new moles or changes in moles, especially if there is a change in shape or color. Also, tell your health care provider if a mole is larger than the size of a pencil eraser.  A one-time screening for abdominal aortic aneurysm (AAA) and surgical repair of large AAAs by ultrasound is recommended for men aged 40-75 years who are current or former smokers.  Stay current with your vaccines (immunizations).   This information is not intended to replace advice given to you by your health care provider. Make sure you discuss any questions you have with your health care  provider.   Document Released: 03/25/2008 Document Revised: 10/18/2014 Document Reviewed: 02/22/2011 Elsevier Interactive Patient Education [employer].

## 2015-12-02 NOTE — Progress Notes (Deleted)
   Subjective:    Patient ID: Michael Berger, male    DOB: 03/28/1961, 55 y.o.   MRN: [id number]  HPI  Michael Berger is a delightful 55 yo male who is here today for his CPE.  He had a sleep study 4 mos prior through [place] [facility].  HTN: GERD: Obesity:  Preventative:  Had screening colonoscopy with Dr. Ardis Hughs 3 mos prior. - due every 3 years as he had 2 tubular adenomatous polyps removed. Last routine labs done 2 1/2 yrs prior and all looked good TDap done 06/2013 Baseline EKG done 2 1/2 yrs prior showed low voltage but was otherwise normal.  Review of Systems     Objective:   Physical Exam        Assessment & Plan:  Psa, hep c, hiv, ua, lipid, cmp, cbc, tsh Flu shot

## 2015-12-02 NOTE — Progress Notes (Signed)
Subjective:    Patient ID: Michael Berger, male    DOB: 1961-05-01, 55 y.o.   MRN: [id number] Chief Complaint  Patient presents with  . Annual Exam    HPI Has had a URI last week but has been off sudafed for 4-5d now.  Had flu shot at [facility] around 3-4 mos piror  Had cereal this morning.  cpap is a big adjustement and then he has had to stop in the past 2 wks because of his URI.  It has been a big adjustement - has had trouble with the mask fitting well.   Has been c/o right hip pain with cortisone inj last mo into his right trochanteric bursitis with numness in his right lateral thigh - seen by [facility] and is in PT.  Has had several episodes of groin tingling  Had problem in neck for which he was seen for cervical DDD as hands go numb occ and was seen at [facility] - he was advised but   Has been sleeping all right which has been helped with a muscle relaxer. Has done prednisone tapers and as needed nsaids.  He has lost his BP machines.  No otc meds.  Is thinking about started b12 and bcomplex.  Has been on cialis intermittently in the past and would like a refill.  Saw Dr. Einar Gip around 2014.  Past Medical History  Diagnosis Date  . Anxiety   . Stress   . Hypertension   . GERD (gastroesophageal reflux disease)   . Sleep apnea     getting to start wearing CPAP   Past Surgical History  Procedure Laterality Date  . Appendectomy      Age 63   No current outpatient prescriptions on file prior to visit.   No current facility-administered medications on file prior to visit.   Allergies  Allergen Reactions  . Other Rash    apples   Family History  Problem Relation Age of Onset  . Hypertension Mother   . Colon cancer Neg Hx   . Esophageal cancer Neg Hx   . Stomach cancer Neg Hx   . Rectal cancer Neg Hx    Social History   Social History  . Marital Status: Married    Spouse Name: N/A  . Number of Children: 2  . Years of Education: N/A   Occupational History   . law enforcement Unemployed    [place] History Main Topics  . Smoking status: Current Some Day Smoker    Types: Cigars  . Smokeless tobacco: Never Used     Comment: cigars- once yearly  . Alcohol Use: 0.0 oz/week    0 Standard drinks or equivalent per week     Comment: Rare; holidays  . Drug Use: No  . Sexual Activity: Not Asked   Other Topics Concern  . None   Social History Narrative   Exercises 3 x's weekly.   Drinks coffee about 8 times a month    Review of Systems  All other systems reviewed and are negative.      Objective:  BP 143/106 mmHg  Pulse 83  Temp(Src) 97.9 F (36.6 C)  Resp 16  Ht 5' 11.5" (1.816 m)  Wt 238 lb (107.956 kg)  BMI 32.74 kg/m2  Physical Exam  Constitutional: He is oriented to person, place, and time. He appears well-developed and well-nourished. No distress.  HENT:  Head: Normocephalic and atraumatic.  Right Ear: Tympanic membrane, external ear and  ear canal normal.  Left Ear: Tympanic membrane, external ear and ear canal normal.  Nose: Nose normal.  Mouth/Throat: Uvula is midline, oropharynx is clear and moist and mucous membranes are normal. No oropharyngeal exudate.  Eyes: Conjunctivae are normal. Right eye exhibits no discharge. Left eye exhibits no discharge. No scleral icterus.  Neck: Normal range of motion. Neck supple. No thyromegaly present.  Cardiovascular: Normal rate, regular rhythm, normal heart sounds and intact distal pulses.   Pulmonary/Chest: Effort normal and breath sounds normal. No respiratory distress.  Abdominal: Soft. Normal appearance and bowel sounds are normal. He exhibits no distension and no mass. There is no hepatosplenomegaly. There is no tenderness. There is no rebound, no guarding and no CVA tenderness.  Genitourinary: Rectum normal and prostate normal. Rectal exam shows anal tone normal. Prostate is not enlarged and not tender.  Musculoskeletal: He exhibits no edema.    Lymphadenopathy:    He has no cervical adenopathy.  Neurological: He is alert and oriented to person, place, and time. He has normal reflexes. No cranial nerve deficit. He exhibits normal muscle tone.  Skin: Skin is warm and dry. No rash noted. He is not diaphoretic. No erythema.  Psychiatric: He has a normal mood and affect. His behavior is normal.      Assessment & Plan:   1. Annual physical exam -tdap done 06/2013; colonoscopy last yr with Dr. Ardis Hughs - repeat in 3 yrs as 2 tubular adenomatous polyps removed.  2. Routine screening for STI (sexually transmitted infection)   3. Screening for cardiovascular, respiratory, and genitourinary diseases - baseline EKG 2.5 yrs prior - low voltage but otherwise nml  4. Screening for deficiency anemia   5. Screening for prostate cancer   6. Screening for thyroid disorder   7. Allergic rhinitis due to pollen   8. Obesity, unspecified   9. Other male erectile dysfunction   10. Essential hypertension   11. Gastroesophageal reflux disease, esophagitis presence not specified   12. OSA on CPAP - just started - diagnosed 4 mos piror at [facility]. DDD (degenerative disc disease), cervical   14. DDD (degenerative disc disease), lumbosacral - has failed diclofenac 75 bid and zanaflex  15. Medication monitoring encounter     Orders Placed This Encounter  Procedures  . Lipid panel    Standing Status: Future     Number of Occurrences:      Standing Expiration Date: 12/01/2016    Order Specific Question:  Has the patient fasted?    Answer:  Yes  . PSA  . Comprehensive metabolic panel    Order Specific Question:  Has the patient fasted?    Answer:  Yes  . HIV antibody  . Hepatitis C Antibody  . CBC  . TSH  . POCT urinalysis dipstick    Meds ordered this encounter  Medications  . fluticasone (FLONASE) 50 MCG/ACT nasal spray    Sig: Place 2 sprays into both nostrils at bedtime.    Dispense:  16 g    Refill:  5  . cetirizine (ZYRTEC)  10 MG tablet    Sig: Take 1 tablet (10 mg total) by mouth at bedtime.    Dispense:  30 tablet    Refill:  11  . tadalafil (CIALIS) 20 MG tablet    Sig: Take 1/4 to 1/2 tab by mouth 30 minutes prior to sexual activity. Never take >1 tab in 24 hours    Dispense:  5 tablet    Refill:  5  . omeprazole (PRILOSEC) 20 MG capsule    Sig: Take 1 capsule (20 mg total) by mouth daily. 30 minutes before a meal    Dispense:  90 capsule    Refill:  3  . losartan-hydrochlorothiazide (HYZAAR) 100-25 MG tablet    Sig: Take 1 tablet by mouth daily.    Dispense:  90 tablet    Refill:  1    Michael Cheadle, MD MPH

## 2015-12-03 ENCOUNTER — Encounter: Payer: Self-pay | Admitting: Family Medicine

## 2015-12-03 LAB — CBC
HCT: 52.3 % — ABNORMAL HIGH (ref 39.0–52.0)
Hemoglobin: 17.9 g/dL — ABNORMAL HIGH (ref 13.0–17.0)
MCH: 28.5 pg (ref 26.0–34.0)
MCHC: 34.2 g/dL (ref 30.0–36.0)
MCV: 83.3 fL (ref 78.0–100.0)
MPV: 9.5 fL (ref 8.6–12.4)
Platelets: 236 10*3/uL (ref 150–400)
RBC: 6.28 MIL/uL — ABNORMAL HIGH (ref 4.22–5.81)
RDW: 15.5 % (ref 11.5–15.5)
WBC: 6.8 10*3/uL (ref 4.0–10.5)

## 2015-12-03 LAB — COMPREHENSIVE METABOLIC PANEL
ALT: 27 U/L (ref 9–46)
AST: 20 U/L (ref 10–35)
Albumin: 4.9 g/dL (ref 3.6–5.1)
Alkaline Phosphatase: 64 U/L (ref 40–115)
BUN: 15 mg/dL (ref 7–25)
CO2: 26 mmol/L (ref 20–31)
Calcium: 10.2 mg/dL (ref 8.6–10.3)
Chloride: 105 mmol/L (ref 98–110)
Creat: 1.18 mg/dL (ref 0.70–1.33)
Glucose, Bld: 85 mg/dL (ref 65–99)
Potassium: 4.2 mmol/L (ref 3.5–5.3)
Sodium: 139 mmol/L (ref 135–146)
Total Bilirubin: 0.5 mg/dL (ref 0.2–1.2)
Total Protein: 8.5 g/dL — ABNORMAL HIGH (ref 6.1–8.1)

## 2015-12-03 LAB — HEPATITIS C ANTIBODY: HCV Ab: NEGATIVE

## 2015-12-03 LAB — PSA: PSA: 1.17 ng/mL (ref <=4.00)

## 2015-12-03 LAB — HIV ANTIBODY (ROUTINE TESTING W REFLEX): HIV 1&2 Ab, 4th Generation: NONREACTIVE

## 2015-12-03 LAB — TSH: TSH: 1.86 mIU/L (ref 0.40–4.50)

## 2015-12-12 NOTE — Progress Notes (Signed)
This encounter was created in error - please disregard.

## 2016-05-12 ENCOUNTER — Telehealth: Payer: Self-pay | Admitting: Neurology

## 2016-05-12 NOTE — Telephone Encounter (Signed)
LM stating that some CPAP problems can be handled by DME company. His is [employer], I left their number [phone]. I also stated that patient can be seen earlier by one of our NPs. Left call back number.

## 2016-05-12 NOTE — Telephone Encounter (Signed)
Pt called appt was scheduled for 8/31. Pt said the insurance would thru August. He is wondering if he could be seen sooner. He said he is some difficulty with the CPAP also.

## 2016-05-12 NOTE — Telephone Encounter (Signed)
Pt called back, r/s for 8/14 with [name]. FYI Dr Rexene Alberts will not be in that day, I did not know until after he hung up. Is that going to be a problem for [name]?

## 2016-05-19 NOTE — Telephone Encounter (Signed)
Patient returned call. Checked with nurse Beverlee Nims regarding if August 14th appointment is okay since Dr. Rexene Alberts is not here that day. Per Beverlee Nims this will be fine.

## 2016-05-24 ENCOUNTER — Ambulatory Visit (INDEPENDENT_AMBULATORY_CARE_PROVIDER_SITE_OTHER): Payer: Commercial Managed Care - HMO | Admitting: Nurse Practitioner

## 2016-05-24 ENCOUNTER — Encounter: Payer: Self-pay | Admitting: Nurse Practitioner

## 2016-05-24 VITALS — BP 147/92 | HR 81 | Ht 71.5 in | Wt 242.2 lb

## 2016-05-24 DIAGNOSIS — G4733 Obstructive sleep apnea (adult) (pediatric): Secondary | ICD-10-CM

## 2016-05-24 DIAGNOSIS — Z9989 Dependence on other enabling machines and devices: Principal | ICD-10-CM

## 2016-05-24 NOTE — Progress Notes (Addendum)
[facility]  PATIENT: Michael Berger DOB: 30-May-1961   REASON FOR VISIT: Follow-up for obstructive sleep apnea with CPAP HISTORY FROM: Patient    HISTORY OF PRESENT ILLNESS: UPDATE 05/24/16 CMMr. Michael Berger 55 year old male returns for follow-up. He was diagnosed with obstructive sleep apnea last year and this is his first follow-up visit for compliance. He has used his CPAP 18/30 days for 60%. His average usage is 5 hours 44 minutes. Set pressure is 10 cm. EPR level III AHI 1.9 Rare leak.  He claims he had a bad cold , sinus infection and problems with his mask on the days  not used. He brought his mask in today. He has questions for the sleep lab. He returns for reevaluation   HISTORY 06/19/15 Michael Berger is a 55 year old right-handed gentleman with an underlying medical history of hypertension, ED, and obesity, who reports snoring and excessive daytime somnolence and waking up with a sense of gagging and gasping.  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 He works 4 days on and 4 days off. He also works part-time during his 4 days off. He goes to bed around 9 PM but often falls asleep before than watching TV. He has come close to dozing off at the wheel but has never fallen asleep at the wheel. His rise time is 4:30 AM but sometimes he is already up around 3 or 3:30 AM and cannot go back to sleep. He denies any nocturia but has woken up with headaches. He has had morning headaches for the past several months. He has had loud snoring and abnormal noises while asleep. This has been going on for months or years. His wife has repeatedly asked him to be checked out for this. She often cannot sleep in the same bedroom with him. He has recently been started on testosterone replacement. He has a history of neck pain which has been going on for a few months. He saw orthopedics at [facility] for this. He had neck imaging testing which showed degenerative spine disease.  He was referred to physical therapy and injection treatment was considered. He started going to physical therapy but then stopped going. He does admit that it was helpful and that his pain improved. He has some intermittent tingling in his hands bilaterally. He has no frank pain. He denies restless leg symptoms but is known to twitch and kick in his sleep. His wife has reported that he kicks his leg and his sleep and he has woken himself up with leg jerking. His Epworth sleepiness score is 17 out of a maximum of 24, his fatigue score is 44 out of 63. He denies a family history of obstructive sleep apnea. He never smoked cigarettes but has rarely smoked cigars. He drinks alcohol occasionally and usually 1 cup of coffee per day on his days on. Very occasionally, a few times a month he works from 10 PM to 7 AM. He has gained weight in the past few years. He has one grown son age 20 who lives in [place], [place]. In the recent past he has had shortness of breath and chest tightness with minimal exertion. He was checked out for this by cardiology and saw Dr. Einar Gip, and test results were benign per patient.   REVIEW OF SYSTEMS: Full 14 system review of systems performed and notable only for those listed, all others are neg:  Constitutional: neg  Cardiovascular: neg Ear/Nose/Throat: Runny nose  Skin: neg Eyes: neg  Respiratory: Cough, sinus infection Gastroitestinal: neg  Hematology/Lymphatic: neg  Endocrine: neg Musculoskeletal:neg Allergy/Immunology: neg Neurological: neg Psychiatric: neg Sleep : neg   ALLERGIES: Allergies  Allergen Reactions  . Other Rash    apples    HOME MEDICATIONS: Outpatient Medications Prior to Visit  Medication Sig Dispense Refill  . losartan-hydrochlorothiazide (HYZAAR) 100-25 MG tablet Take 1 tablet by mouth daily. 90 tablet 1  . omeprazole (PRILOSEC) 20 MG capsule Take 1 capsule (20 mg total) by mouth daily. 30 minutes before a meal 90 capsule 3  . tadalafil  (CIALIS) 20 MG tablet Take 1/4 to 1/2 tab by mouth 30 minutes prior to sexual activity. Never take >1 tab in 24 hours 5 tablet 5  . cetirizine (ZYRTEC) 10 MG tablet Take 1 tablet (10 mg total) by mouth at bedtime. (Patient not taking: Reported on 05/24/2016) 30 tablet 11  . fluticasone (FLONASE) 50 MCG/ACT nasal spray Place 2 sprays into both nostrils at bedtime. (Patient not taking: Reported on 05/24/2016) 16 g 5   No facility-administered medications prior to visit.     PAST MEDICAL HISTORY: Past Medical History:  Diagnosis Date  . Anxiety   . GERD (gastroesophageal reflux disease)   . Hypertension   . Sleep apnea    getting to start wearing CPAP  . Stress     PAST SURGICAL HISTORY: Past Surgical History:  Procedure Laterality Date  . APPENDECTOMY     Age 30    FAMILY HISTORY: Family History  Problem Relation Age of Onset  . Hypertension Mother   . Colon cancer Neg Hx   . Esophageal cancer Neg Hx   . Stomach cancer Neg Hx   . Rectal cancer Neg Hx     SOCIAL HISTORY: Social History   Social History  . Marital status: Married    Spouse name: N/A  . Number of children: 2  . Years of education: N/A   Occupational History  . law enforcement Unemployed    [place] History Main Topics  . Smoking status: Never Smoker  . Smokeless tobacco: Never Used     Comment: cigars- once yearly  . Alcohol use 0.0 oz/week     Comment: Rare; holidays  . Drug use: No  . Sexual activity: Not on file   Other Topics Concern  . Not on file   Social History Narrative   Exercises 3 x's weekly.   Drinks coffee about 8 times a month     PHYSICAL EXAM  Vitals:   05/24/16 1420  BP: (!) 147/92  Pulse: 81  Weight: 242 lb 3.2 oz (109.9 kg)  Height: 5' 11.5" (1.816 m)   Body mass index is 33.31 kg/m.  Generalized: Well developed, Obese male in no acute distress  Head: normocephalic and atraumatic,. Oropharynx benign  Neck: Supple, no carotid bruits    Cardiac: Regular rate rhythm, no murmur  Musculoskeletal: No deformity   Neurological examination   Mentation: Alert oriented to time, place, history taking. Attention span and concentration appropriate. Recent and remote memory intact.  Follows all commands speech and language fluent. ESS 17 FSS 35.   Cranial nerve II-XII: Pupils were equal round reactive to light extraocular movements were full, visual field were full on confrontational test. Facial sensation and strength were normal. hearing was intact to finger rubbing bilaterally. Uvula tongue midline. head turning and shoulder shrug were normal and symmetric.Tongue protrusion into cheek strength was normal. Motor: normal bulk and tone, full strength in the  BUE, BLE, fine finger movements normal, no pronator drift. No focal weakness Sensory: normal and symmetric to light touch, pinprick, and  Vibration, proprioception  Coordination: finger-nose-finger, heel-to-shin bilaterally, no dysmetria Reflexes: Brachioradialis 2/2, biceps 2/2, triceps 2/2, patellar 2/2, Achilles 2/2, plantar responses were flexor bilaterally. Gait and Station: Rising up from seated position without assistance, normal stance,  moderate stride, good arm swing, smooth turning, able to perform tiptoe, and heel walking without difficulty. Tandem gait is steady  DIAGNOSTIC DATA (LABS, IMAGING, TESTING) - I reviewed patient records, labs, notes, testing and imaging myself where available.  Lab Results  Component Value Date   WBC 6.8 12/02/2015   HGB 17.9 (H) 12/02/2015   HCT 52.3 (H) 12/02/2015   MCV 83.3 12/02/2015   PLT 236 12/02/2015      Component Value Date/Time   NA 139 12/02/2015 1150   K 4.2 12/02/2015 1150   CL 105 12/02/2015 1150   CO2 26 12/02/2015 1150   GLUCOSE 85 12/02/2015 1150   BUN 15 12/02/2015 1150   CREATININE 1.18 12/02/2015 1150   CALCIUM 10.2 12/02/2015 1150   PROT 8.5 (H) 12/02/2015 1150   ALBUMIN 4.9 12/02/2015 1150   AST 20  12/02/2015 1150   ALT 27 12/02/2015 1150   ALKPHOS 64 12/02/2015 1150   BILITOT 0.5 12/02/2015 1150     Lab Results  Component Value Date   TSH 1.86 12/02/2015      ASSESSMENT AND PLAN  55 y.o. year old male  has a past medical history of Anxiety; GERD (gastroesophageal reflux disease); Hypertension; Sleep apnea; and Stress. here To follow-up for his obstructive sleep apnea.He has used his CPAP 18/30 days for 60%. His average usage is 5 hours 44 minutes. Set pressure is 10 cm. EPR level III AHI 1.9 Rare leak.  He claims he had a bad cold and sinus infection and problems with his mask on the days  not used.  PLAN: Continue CPAP at current settings Check with sleep lab on the way out for mask fitting Follow-up in 6 months for repeat compliance Dennie Bible, [facility], [facility], APRN  [facility] [address] [place], [place] [address] [phone]  I reviewed the above note and documentation by the Nurse Practitioner and agree with the history, physical exam, assessment and plan as outlined above. I was immediately available for face-to-face consultation. Star Age, MD, PhD [facility])

## 2016-05-24 NOTE — Patient Instructions (Signed)
Continue CPAP at current settings Check with sleep lab on the way out for mask fitting Follow-up in 6 months

## 2016-06-03 ENCOUNTER — Ambulatory Visit: Payer: Commercial Managed Care - HMO | Admitting: Family Medicine

## 2016-06-10 ENCOUNTER — Ambulatory Visit: Payer: Commercial Managed Care - HMO | Admitting: Neurology

## 2016-09-10 ENCOUNTER — Encounter: Payer: Self-pay | Admitting: Nurse Practitioner

## 2016-10-02 ENCOUNTER — Other Ambulatory Visit: Payer: Self-pay | Admitting: Family Medicine

## 2016-10-05 ENCOUNTER — Ambulatory Visit (INDEPENDENT_AMBULATORY_CARE_PROVIDER_SITE_OTHER): Payer: Commercial Managed Care - HMO | Admitting: Emergency Medicine

## 2016-10-05 VITALS — BP 122/82 | HR 106 | Temp 98.5°F | Resp 18 | Ht 71.5 in | Wt 244.0 lb

## 2016-10-05 DIAGNOSIS — R112 Nausea with vomiting, unspecified: Secondary | ICD-10-CM

## 2016-10-05 DIAGNOSIS — I10 Essential (primary) hypertension: Secondary | ICD-10-CM

## 2016-10-05 MED ORDER — ONDANSETRON 4 MG PO TBDP: 4.0000 mg | ORAL_TABLET | Freq: Three times a day (TID) | ORAL | 0 refills | Status: DC | PRN

## 2016-10-05 MED ORDER — PROMETHAZINE HCL 25 MG/ML IJ SOLN
50.0000 mg | Freq: Once | INTRAMUSCULAR | Status: AC
  Administered 2016-10-05: 50 mg via INTRAMUSCULAR

## 2016-10-05 MED ORDER — LOSARTAN POTASSIUM-HCTZ 100-25 MG PO TABS: 1.0000 | ORAL_TABLET | Freq: Every day | ORAL | 2 refills | Status: DC

## 2016-10-05 NOTE — Patient Instructions (Addendum)
IF you received an x-ray today, you will receive an invoice from [facility]. Please contact [facility] at [phone] with questions or concerns regarding your invoice.   IF you received labwork today, you will receive an invoice from [facility]. Please contact [facility] at [phone] with questions or concerns regarding your invoice.   Our billing staff will not be able to assist you with questions regarding bills from these companies.  You will be contacted with the lab results as soon as they are available. The fastest way to get your results is to activate your My Chart account. Instructions are located on the last page of this paperwork. If you have not heard from [place] regarding the results in 2 weeks, please contact this office.      Nausea and Vomiting, Adult Introduction Feeling sick to your stomach (nausea) means that your stomach is upset or you feel like you have to throw up (vomit). Feeling more and more sick to your stomach can lead to throwing up. Throwing up happens when food and liquid from your stomach are thrown up and out the mouth. Throwing up can make you feel weak and cause you to get dehydrated. Dehydration can make you tired and thirsty, make you have a dry mouth, and make it so you pee (urinate) less often. Older adults and people with other diseases or a weak defense system (immune system) are at higher risk for dehydration. If you feel sick to your stomach or if you throw up, it is important to follow instructions from your doctor about how to take care of yourself. Follow these instructions at home: Eating and drinking Follow these instructions as told by your doctor:  Take an oral rehydration solution (ORS). This is a drink that is sold at pharmacies and stores.  Drink clear fluids in small amounts as you are able, such as:  Water.  Ice chips.  Diluted fruit juice.  Low-calorie sports drinks.  Eat bland, easy-to-digest foods in  small amounts as you are able, such as:  Bananas.  Applesauce.  Rice.  Low-fat (lean) meats.  Toast.  Crackers.  Avoid fluids that have a lot of sugar or caffeine in them.  Avoid alcohol.  Avoid spicy or fatty foods. General instructions  Drink enough fluid to keep your pee (urine) clear or pale yellow.  Wash your hands often. If you cannot use soap and water, use hand sanitizer.  Make sure that all people in your home wash their hands well and often.  Take over-the-counter and prescription medicines only as told by your doctor.  Rest at home while you get better.  Watch your condition for any changes.  Breathe slowly and deeply when you feel sick to your stomach.  Keep all follow-up visits as told by your doctor. This is important. Contact a doctor if:  You have a fever.  You cannot keep fluids down.  Your symptoms get worse.  You have new symptoms.  You feel sick to your stomach for more than two days.  You feel light-headed or dizzy.  You have a headache.  You have muscle cramps. Get help right away if:  You have pain in your chest, neck, arm, or jaw.  You feel very weak or you pass out (faint).  You throw up again and again.  You see blood in your throw-up.  Your throw-up looks like black coffee grounds.  You have bloody or black poop (stools) or poop that look like tar.  You have a very bad headache, a stiff neck, or both.  You have a rash.  You have very bad pain, cramping, or bloating in your belly (abdomen).  You have trouble breathing.  You are breathing very quickly.  Your heart is beating very quickly.  Your skin feels cold and clammy.  You feel confused.  You have pain when you pee.  You have signs of dehydration, such as:  Dark pee, hardly any pee, or no pee.  Cracked lips.  Dry mouth.  Sunken eyes.  Sleepiness.  Weakness. These symptoms may be an emergency. Do not wait to see if the symptoms will go away.  Get medical help right away. Call your local emergency services (911 in the [place] Do not drive yourself to the hospital.  This information is not intended to replace advice given to you by your health care provider. Make sure you discuss any questions you have with your health care provider. Document Released: 03/15/2008 Document Revised: 04/16/2016 Document Reviewed: 06/03/2015  2017 [employer]vier

## 2016-10-05 NOTE — Progress Notes (Signed)
Michael Berger 55 y.o.   Chief Complaint  Patient presents with  . Medication Refill    Losartan  . Emesis    started today; has been vomiting all day; denies having diarrhea  . Fatigue    started today    HISTORY OF PRESENT ILLNESS: This is a 55 y.o. male complaining of vomiting and diffuse abdominal bloating and cramping since am today. No other significant symptoms.  Emesis   This is a new problem. The current episode started today (am). The problem occurs 2 to 4 times per day. The problem has been unchanged. There has been no fever. Associated symptoms include abdominal pain, chest pain, chills and coughing. Pertinent negatives include no diarrhea, dizziness, fever, headaches, myalgias or URI. Risk factors include suspect food intake. He has tried diet change (no eating small amount of fluids) for the symptoms. The treatment provided no relief.     Prior to Admission medications   Medication Sig Start Date End Date Taking? Authorizing Provider  cetirizine (ZYRTEC) 10 MG tablet Take 1 tablet (10 mg total) by mouth at bedtime. 12/02/15  Yes Michael Knapp, MD  losartan-hydrochlorothiazide (HYZAAR) 100-25 MG tablet TAKE 1 TABLET BY MOUTH DAILY. 10/05/16  Yes Michael Knapp, MD  omeprazole (PRILOSEC) 20 MG capsule Take 1 capsule (20 mg total) by mouth daily. 30 minutes before a meal 12/02/15  Yes Michael Knapp, MD  tadalafil (CIALIS) 20 MG tablet Take 1/4 to 1/2 tab by mouth 30 minutes prior to sexual activity. Never take >1 tab in 24 hours 12/02/15  Yes Michael Knapp, MD    Allergies  Allergen Reactions  . Other Rash    apples    Patient Active Problem List   Diagnosis Date Noted  . GERD (gastroesophageal reflux disease) 12/02/2015  . OSA on CPAP 12/02/2015  . DDD (degenerative disc disease), cervical 12/02/2015  . DDD (degenerative disc disease), lumbosacral 12/02/2015  . Essential hypertension 04/28/2014  . Erectile dysfunction 04/26/2014  . Obesity, unspecified 07/06/2013    Past  Medical History:  Diagnosis Date  . Anxiety   . GERD (gastroesophageal reflux disease)   . Hypertension   . Sleep apnea    getting to start wearing CPAP  . Stress     Past Surgical History:  Procedure Laterality Date  . APPENDECTOMY     Age 27    Social History   Social History  . Marital status: Married    Spouse name: N/A  . Number of children: 2  . Years of education: N/A   Occupational History  . law enforcement Unemployed    [place] History Main Topics  . Smoking status: Never Smoker  . Smokeless tobacco: Never Used     Comment: cigars- once yearly  . Alcohol use 0.0 oz/week     Comment: Rare; holidays  . Drug use: No  . Sexual activity: Not on file   Other Topics Concern  . Not on file   Social History Narrative   Exercises 3 x's weekly.   Drinks coffee about 8 times a month    Family History  Problem Relation Age of Onset  . Hypertension Mother   . Colon cancer Neg Hx   . Esophageal cancer Neg Hx   . Stomach cancer Neg Hx   . Rectal cancer Neg Hx      Review of Systems  Constitutional: Positive for chills and malaise/fatigue. Negative for diaphoresis and fever.  HENT: Negative.  Eyes: Negative.   Respiratory: Positive for cough. Negative for hemoptysis, shortness of breath and wheezing.   Cardiovascular: Positive for chest pain. Negative for palpitations and leg swelling.  Gastrointestinal: Positive for abdominal pain, nausea and vomiting. Negative for blood in stool, constipation, diarrhea and melena.  Genitourinary: Negative.   Musculoskeletal: Negative for back pain, joint pain, myalgias and neck pain.  Skin: Negative.   Neurological: Positive for weakness. Negative for dizziness and headaches.  Endo/Heme/Allergies: Negative.   Psychiatric/Behavioral: Negative.      Physical Exam  Constitutional: He is oriented to person, place, and time. He appears well-developed and well-nourished.  HENT:  Head: Normocephalic  and atraumatic.  Eyes: Conjunctivae and EOM are normal. Pupils are equal, round, and reactive to light.  Neck: Normal range of motion. Neck supple.  Cardiovascular: Normal rate, regular rhythm, normal heart sounds and intact distal pulses.   Pulmonary/Chest: Effort normal and breath sounds normal.  Abdominal: Soft. Bowel sounds are normal. He exhibits no distension and no mass. There is tenderness (mild diffuse). There is no rebound.  Musculoskeletal: Normal range of motion.  Neurological: He is alert and oriented to person, place, and time.  Skin: Skin is warm and dry.  Vitals reviewed.  Vitals:   10/05/16 1447  BP: 122/82  Pulse: (!) 106  Resp: 18  Temp: 98.5 F (36.9 C)     ASSESSMENT & PLAN: Michael Berger was seen today for medication refill, emesis and fatigue.  Diagnoses and all orders for this visit:  Intractable vomiting with nausea, unspecified vomiting type -     promethazine (PHENERGAN) injection 50 mg; Inject 2 mLs (50 mg total) into the muscle once. -     ondansetron (ZOFRAN ODT) 4 MG disintegrating tablet; Take 1 tablet (4 mg total) by mouth every 8 (eight) hours as needed for nausea or vomiting. -     losartan-hydrochlorothiazide (HYZAAR) 100-25 MG tablet; Take 1 tablet by mouth daily. -     Care order/instruction:  Essential hypertension  Ddx d/w patient and wife. Most likely this is a food poisoning variant without diarrhea but warned about other possibilities that may warrant diagnostic workup in the Er. Advised to take Zofran as needed, hold BP med for 24 hours and remain on liquid diet for 24 hours.    Michael Caroli, MD [facility]

## 2016-10-05 NOTE — Telephone Encounter (Signed)
11/2015 last ov and labs needs ov

## 2016-10-15 ENCOUNTER — Telehealth: Payer: Self-pay

## 2016-10-15 NOTE — Telephone Encounter (Signed)
Pt is needing to get a refill on his acid reflux meds and cialis   Best number [phone]

## 2016-10-17 NOTE — Telephone Encounter (Signed)
Last seen for annual 11/2015

## 2016-10-18 ENCOUNTER — Ambulatory Visit (INDEPENDENT_AMBULATORY_CARE_PROVIDER_SITE_OTHER): Payer: Commercial Managed Care - HMO | Admitting: Family Medicine

## 2016-10-18 ENCOUNTER — Encounter: Payer: Self-pay | Admitting: Family Medicine

## 2016-10-18 VITALS — BP 136/86 | HR 84 | Temp 97.6°F | Resp 16 | Ht 71.5 in | Wt 244.0 lb

## 2016-10-18 DIAGNOSIS — R3916 Straining to void: Secondary | ICD-10-CM

## 2016-10-18 DIAGNOSIS — D751 Secondary polycythemia: Secondary | ICD-10-CM

## 2016-10-18 DIAGNOSIS — R779 Abnormality of plasma protein, unspecified: Secondary | ICD-10-CM

## 2016-10-18 DIAGNOSIS — K219 Gastro-esophageal reflux disease without esophagitis: Secondary | ICD-10-CM | POA: Diagnosis not present

## 2016-10-18 DIAGNOSIS — R112 Nausea with vomiting, unspecified: Secondary | ICD-10-CM

## 2016-10-18 DIAGNOSIS — Z9989 Dependence on other enabling machines and devices: Secondary | ICD-10-CM | POA: Diagnosis not present

## 2016-10-18 DIAGNOSIS — I10 Essential (primary) hypertension: Secondary | ICD-10-CM

## 2016-10-18 DIAGNOSIS — G4733 Obstructive sleep apnea (adult) (pediatric): Secondary | ICD-10-CM

## 2016-10-18 DIAGNOSIS — E8809 Other disorders of plasma-protein metabolism, not elsewhere classified: Secondary | ICD-10-CM | POA: Diagnosis not present

## 2016-10-18 DIAGNOSIS — Z5181 Encounter for therapeutic drug level monitoring: Secondary | ICD-10-CM | POA: Diagnosis not present

## 2016-10-18 DIAGNOSIS — N401 Enlarged prostate with lower urinary tract symptoms: Secondary | ICD-10-CM | POA: Diagnosis not present

## 2016-10-18 MED ORDER — OMEPRAZOLE 20 MG PO CPDR: 20.0000 mg | DELAYED_RELEASE_CAPSULE | Freq: Every day | ORAL | 3 refills | Status: DC

## 2016-10-18 MED ORDER — TADALAFIL 5 MG PO TABS: 5.0000 mg | ORAL_TABLET | Freq: Every day | ORAL | 3 refills | Status: DC

## 2016-10-18 MED ORDER — FLUTICASONE PROPIONATE 50 MCG/ACT NA SUSP: 2.0000 | Freq: Every day | NASAL | 2 refills | Status: DC

## 2016-10-18 MED ORDER — LOSARTAN POTASSIUM-HCTZ 100-25 MG PO TABS: 1.0000 | ORAL_TABLET | Freq: Every day | ORAL | 3 refills | Status: DC

## 2016-10-18 NOTE — Patient Instructions (Addendum)
     IF you received an x-ray today, you will receive an invoice from [facility]. Please contact [facility] at [phone] with questions or concerns regarding your invoice.   IF you received labwork today, you will receive an invoice from [facility]. Please contact [facility] at [phone] with questions or concerns regarding your invoice.   Our billing staff will not be able to assist you with questions regarding bills from these companies.  You will be contacted with the lab results as soon as they are available. The fastest way to get your results is to activate your My Chart account. Instructions are located on the last page of this paperwork. If you have not heard from [place] regarding the results in 2 weeks, please contact this office.     Benign Prostatic Hyperplasia An enlarged prostate (benign prostatic hyperplasia) is common in older men. You may experience the following:  Weak urine stream.  Dribbling.  Feeling like the bladder has not emptied completely.  Difficulty starting urination.  Getting up frequently at night to urinate.  Urinating more frequently during the day. HOME CARE INSTRUCTIONS  Monitor your prostatic hyperplasia for any changes. The following actions may help to alleviate any discomfort you are experiencing:  Give yourself time when you urinate.  Stay away from alcohol.  Avoid beverages containing caffeine, such as coffee, tea, and colas, because they can make the problem worse.  Avoid decongestants, antihistamines, and some prescription medicines that can make the problem worse.  Follow up with your health care provider for further treatment as recommended. SEEK MEDICAL CARE IF:  You are experiencing progressive difficulty voiding.  Your urine stream is progressively getting narrower.  You are awaking from sleep with the urge to void more frequently.  You are constantly feeling the need to void.  You experience loss of  urine, especially in small amounts. SEEK IMMEDIATE MEDICAL CARE IF:   You develop increased pain with urination or are unable to urinate.  You develop severe abdominal pain, vomiting, a high fever, or fainting.  You develop back pain or blood in your urine. MAKE SURE YOU:   Understand these instructions.  Will watch your condition.  Will get help right away if you are not doing well or get worse. This information is not intended to replace advice given to you by your health care provider. Make sure you discuss any questions you have with your health care provider. Document Released: 09/27/2005 Document Revised: 10/18/2014 Document Reviewed: 02/27/2013 Elsevier Interactive Patient Education  2017 [employer].

## 2016-10-18 NOTE — Progress Notes (Signed)
Subjective:    Patient ID: Michael Berger, male    DOB: May 06, 1961, 56 y.o.   MRN: [id number] Chief Complaint  Patient presents with  . Medication Refill    prilosec and losartan    HPI   Michael Berger is a delightful 56 yo male here for a routine follow-up on his chronic medical conditions.  HTN: Doing well.  He does get thigh cramping  Does get a fair amount of fruit in diet but not veggies.  The ppi keeps his GERD undercrolled and notes that it is very dependent upon his diet. It works   Sleep not as good as it initially was since starting his CPAP.  Did have a hard time adjusting to the mask - went through 14 different ones.  Has noted that he was initially gasping occ through his mouth during the day and now he just snorts through his nose. No rhinitis/congestion, no dryness, no PND.  Has never seen urology prior.  Does have some indigestion/early satiety with cialis - feels like it causes some abd pain and like food isn't digesting. Can't remember if it is better with or without food. His cardiologist told him cialis with the best of the phosphodiesterase inhibitors to use for people with cardiac disease.  He has been having worsening bph urinary sxs with a weakened and spread urinary stream as well as some post-void dribbling.   Depression screen [facility] 2/9 10/18/2016 10/05/2016 12/02/2015 06/18/2015 04/26/2014  Decreased Interest 0 0 0 0 0  Down, Depressed, Hopeless 0 0 0 0 0  PHQ - 2 Score 0 0 0 0 0   Past Medical History:  Diagnosis Date  . Anxiety   . GERD (gastroesophageal reflux disease)   . Hypertension   . Sleep apnea    getting to start wearing CPAP  . Stress    Past Surgical History:  Procedure Laterality Date  . APPENDECTOMY     Age 22   No current outpatient prescriptions on file prior to visit.   No current facility-administered medications on file prior to visit.    Allergies  Allergen Reactions  . Other Rash    apples   Family History  Problem Relation  Age of Onset  . Hypertension Mother   . Colon cancer Neg Hx   . Esophageal cancer Neg Hx   . Stomach cancer Neg Hx   . Rectal cancer Neg Hx    Social History   Social History  . Marital status: Married    Spouse name: N/A  . Number of children: 2  . Years of education: N/A   Occupational History  . law enforcement Unemployed    [place] History Main Topics  . Smoking status: Never Smoker  . Smokeless tobacco: Never Used     Comment: cigars- once yearly  . Alcohol use 0.0 oz/week     Comment: Rare; holidays  . Drug use: No  . Sexual activity: Not Asked   Other Topics Concern  . None   Social History Narrative   Exercises 3 x's weekly.   Drinks coffee about 8 times a month    Review of Systems  Constitutional: Negative for chills and fever.  Eyes: Negative for visual disturbance.  Respiratory: Negative for shortness of breath.   Cardiovascular: Negative for chest pain and leg swelling.  Neurological: Negative for dizziness, syncope, facial asymmetry, weakness, light-headedness and headaches.       Objective:   Physical Exam  Constitutional: He is oriented to person, place, and time. He appears well-developed and well-nourished. No distress.  HENT:  Head: Normocephalic and atraumatic.  Right Ear: Tympanic membrane is retracted.  Left Ear: Tympanic membrane is retracted.  Nose: Rhinorrhea (and erythema) present.  Eyes: Conjunctivae are normal. Pupils are equal, round, and reactive to light. No scleral icterus.  Neck: Normal range of motion. Neck supple. No thyromegaly present.  Cardiovascular: Normal rate, regular rhythm, normal heart sounds and intact distal pulses.   Pulmonary/Chest: Effort normal and breath sounds normal. No respiratory distress.  Musculoskeletal: He exhibits no edema.  Lymphadenopathy:    He has no cervical adenopathy.  Neurological: He is alert and oriented to person, place, and time.  Skin: Skin is warm and dry. He is  not diaphoretic.  Psychiatric: He has a normal mood and affect. His behavior is normal.    BP 136/86   Pulse 84   Temp 97.6 F (36.4 C) (Oral)   Resp 16   Ht 5' 11.5" (1.816 m)   Wt 244 lb (110.7 kg)   SpO2 95%   BMI 33.56 kg/m      Assessment & Plan:   1. Gastroesophageal reflux disease, esophagitis presence not specified - uses ppi prn, when eating triggering foods.  2. Essential hypertension - well controlled on losartan-hctz 100-25  3. Medication monitoring encounter   4. Elevated blood protein - recheck  5. Intractable vomiting with nausea, unspecified vomiting type - resolved  6. Benign prostatic hyperplasia (BPH) with straining on urination - rec trial of cialis 5 qd, check psa  7. Polycythemia, secondary - recheck, isolated, suspect was secondary to untreated ppi  8. OSA on CPAP - using but wondering if the mask is slipping as he has not felt as well-rested upon awakening as he initially did. Will f/u with neuro soon. Has developed sudden snorts/gasps during day since starting cpap so will order cleaning/sterilizing supplies - ok to have dme co send rx for my sig. Start trial of flonase qhs for nasal cong/snorting though if not working in a mo may need to try nasal saline during day instead  Not fasting today so will RTC for lab only visit for lipid panel.  Orders Placed This Encounter  Procedures  . CBC  . Comprehensive metabolic panel    Order Specific Question:   Has the patient fasted?    Answer:   Yes  . Lipid panel    Standing Status:   Future    Standing Expiration Date:   10/18/2017    Order Specific Question:   Has the patient fasted?    Answer:   Yes  . PSA  . Care order/instruction:    AVS printed - let patient go!    Meds ordered this encounter  Medications  . losartan-hydrochlorothiazide (HYZAAR) 100-25 MG tablet    Sig: Take 1 tablet by mouth daily.    Dispense:  90 tablet    Refill:  3  . omeprazole (PRILOSEC) 20 MG capsule    Sig: Take 1  capsule (20 mg total) by mouth daily. 30 minutes before a meal    Dispense:  90 capsule    Refill:  3  . tadalafil (CIALIS) 5 MG tablet    Sig: Take 1 tablet (5 mg total) by mouth daily.    Dispense:  90 tablet    Refill:  3  . fluticasone (FLONASE) 50 MCG/ACT nasal spray    Sig: Place 2 sprays into both nostrils at bedtime.  Dispense:  16 g    Refill:  2   F/u 6 mos for CPE  Delman Cheadle, M.D.  [facility] [address] [place], [place] [address] [phone] phone [phone] fax  10/18/16 9:40 PM

## 2016-10-18 NOTE — Telephone Encounter (Signed)
Lm needs ov for refills

## 2016-10-19 LAB — COMPREHENSIVE METABOLIC PANEL
ALT: 32 IU/L (ref 0–44)
AST: 26 IU/L (ref 0–40)
Albumin/Globulin Ratio: 1.5 (ref 1.2–2.2)
Albumin: 4.8 g/dL (ref 3.5–5.5)
Alkaline Phosphatase: 71 IU/L (ref 39–117)
BUN/Creatinine Ratio: 9 (ref 9–20)
BUN: 11 mg/dL (ref 6–24)
Bilirubin Total: 0.4 mg/dL (ref 0.0–1.2)
CO2: 19 mmol/L (ref 18–29)
Calcium: 9.9 mg/dL (ref 8.7–10.2)
Chloride: 103 mmol/L (ref 96–106)
Creatinine, Ser: 1.27 mg/dL (ref 0.76–1.27)
GFR calc Af Amer: 74 mL/min/{1.73_m2} (ref >59)
GFR calc non Af Amer: 64 mL/min/{1.73_m2} (ref >59)
Globulin, Total: 3.2 g/dL (ref 1.5–4.5)
Glucose: 91 mg/dL (ref 65–99)
Potassium: 4.3 mmol/L (ref 3.5–5.2)
Sodium: 143 mmol/L (ref 134–144)
Total Protein: 8 g/dL (ref 6.0–8.5)

## 2016-10-19 LAB — CBC
Hematocrit: 52.1 % — ABNORMAL HIGH (ref 37.5–51.0)
Hemoglobin: 18.2 g/dL — ABNORMAL HIGH (ref 13.0–17.7)
MCH: 29.5 pg (ref 26.6–33.0)
MCHC: 34.9 g/dL (ref 31.5–35.7)
MCV: 84 fL (ref 79–97)
Platelets: 223 10*3/uL (ref 150–379)
RBC: 6.17 x10E6/uL — ABNORMAL HIGH (ref 4.14–5.80)
RDW: 15.1 % (ref 12.3–15.4)
WBC: 9.6 10*3/uL (ref 3.4–10.8)

## 2016-10-19 LAB — PSA: Prostate Specific Ag, Serum: 1.4 ng/mL (ref 0.0–4.0)

## 2016-10-22 NOTE — Telephone Encounter (Signed)
Patient wants to know if the five pills dr. Brigitte Pulse precribed for cilas is for daily consumption.  He thought Dr. Brigitte Pulse told him to take one a day, so he is concerned he does not have enough.  Please call  [phone]

## 2016-11-01 ENCOUNTER — Telehealth: Payer: Self-pay | Admitting: *Deleted

## 2016-11-01 NOTE — Telephone Encounter (Signed)
Patient called stating the pharmacy sent back his cialis Friday, patient states the nurse needs to send it back to the pharmacy.Per pt the insurance company denied it. Please advise [phone]

## 2016-11-01 NOTE — Telephone Encounter (Signed)
CILAIS wants daily - Dr.

## 2016-11-04 NOTE — Telephone Encounter (Signed)
10/15/16 last refill insurance wont cover cialis for ED

## 2016-11-09 ENCOUNTER — Telehealth: Payer: Self-pay

## 2016-11-09 NOTE — Telephone Encounter (Signed)
Patient is calling to check on his cialis medication.  He states that he is waiting on preauthorization for this medication to be taken daily.  Please advise  [phone]

## 2016-11-10 MED ORDER — TADALAFIL 5 MG PO TABS
5.0000 mg | ORAL_TABLET | Freq: Every day | ORAL | 3 refills | Status: DC
Start: 2016-11-10 — End: 2017-10-27

## 2016-11-10 NOTE — Telephone Encounter (Signed)
done

## 2016-11-10 NOTE — Telephone Encounter (Signed)
PA completed for tadalafil/Cialis for BPH with urinary dysfunction/straining.  Key:  [id number] - pending x up to 72 hours

## 2016-11-10 NOTE — Telephone Encounter (Signed)
Pt is being Rx this for BPH with straining per note from 10/18/2016 - Michael Berger is going to start the PA process.

## 2016-11-10 NOTE — Telephone Encounter (Signed)
Approved through 11/10/17 Pt advised, please resend daily Cialis to [facility] [phone]

## 2016-11-16 NOTE — Telephone Encounter (Signed)
Insurance wont cover cialis type meds  for ED dx. Pharmacy advised  [place] [phone]

## 2016-11-24 ENCOUNTER — Ambulatory Visit: Payer: Commercial Managed Care - HMO | Admitting: Nurse Practitioner

## 2017-07-06 ENCOUNTER — Ambulatory Visit (INDEPENDENT_AMBULATORY_CARE_PROVIDER_SITE_OTHER): Payer: Commercial Managed Care - HMO

## 2017-07-06 ENCOUNTER — Encounter: Payer: Self-pay | Admitting: Family Medicine

## 2017-07-06 ENCOUNTER — Ambulatory Visit (INDEPENDENT_AMBULATORY_CARE_PROVIDER_SITE_OTHER): Payer: 59 | Admitting: Family Medicine

## 2017-07-06 VITALS — BP 134/86 | HR 100 | Temp 97.5°F | Resp 18 | Ht 71.0 in | Wt 238.2 lb

## 2017-07-06 DIAGNOSIS — K920 Hematemesis: Secondary | ICD-10-CM

## 2017-07-06 DIAGNOSIS — R42 Dizziness and giddiness: Secondary | ICD-10-CM | POA: Diagnosis not present

## 2017-07-06 DIAGNOSIS — K219 Gastro-esophageal reflux disease without esophagitis: Secondary | ICD-10-CM | POA: Diagnosis not present

## 2017-07-06 DIAGNOSIS — I10 Essential (primary) hypertension: Secondary | ICD-10-CM

## 2017-07-06 DIAGNOSIS — D751 Secondary polycythemia: Secondary | ICD-10-CM

## 2017-07-06 DIAGNOSIS — R079 Chest pain, unspecified: Secondary | ICD-10-CM

## 2017-07-06 DIAGNOSIS — G4733 Obstructive sleep apnea (adult) (pediatric): Secondary | ICD-10-CM

## 2017-07-06 DIAGNOSIS — Z5181 Encounter for therapeutic drug level monitoring: Secondary | ICD-10-CM | POA: Diagnosis not present

## 2017-07-06 DIAGNOSIS — Z9989 Dependence on other enabling machines and devices: Secondary | ICD-10-CM

## 2017-07-06 DIAGNOSIS — A048 Other specified bacterial intestinal infections: Secondary | ICD-10-CM

## 2017-07-06 DIAGNOSIS — R5382 Chronic fatigue, unspecified: Secondary | ICD-10-CM

## 2017-07-06 LAB — POCT CBC
Granulocyte percent: 62.2 %G (ref 37–80)
HCT, POC: 55.4 % — AB (ref 43.5–53.7)
Hemoglobin: 18.5 g/dL — AB (ref 14.1–18.1)
Lymph, poc: 2.5 (ref 0.6–3.4)
MCH, POC: 28.3 pg (ref 27–31.2)
MCHC: 33.3 g/dL (ref 31.8–35.4)
MCV: 84.8 fL (ref 80–97)
MID (cbc): 0.1 (ref 0–0.9)
MPV: 7.4 fL (ref 0–99.8)
POC Granulocyte: 4.4 (ref 2–6.9)
POC LYMPH PERCENT: 35.8 %L (ref 10–50)
POC MID %: 2 %M (ref 0–12)
Platelet Count, POC: 214 10*3/uL (ref 142–424)
RBC: 6.53 M/uL — AB (ref 4.69–6.13)
RDW, POC: 14.7 %
WBC: 7 10*3/uL (ref 4.6–10.2)

## 2017-07-06 MED ORDER — PANTOPRAZOLE SODIUM 40 MG PO TBEC: 40.0000 mg | DELAYED_RELEASE_TABLET | Freq: Every day | ORAL | 8 refills | Status: DC

## 2017-07-06 NOTE — Patient Instructions (Addendum)
Start the protonix 30 minutes before a meal - likely dinner would be the best. Start the zantac twice a day. After 2 months, stop the protonix. It is fine to go BACK on the protonix for a 1-2 month course a few times a year to keep your symptoms under control and then use the zantac between the periods on the protonix. As long as your stomach is getting occasional drug holidays from the "proton pump inhibitor" type of medication, you will not have the side effects.    IF you received an x-ray today, you will receive an invoice from [facility]. Please contact [facility] at [phone] with questions or concerns regarding your invoice.   IF you received labwork today, you will receive an invoice from [facility]. Please contact [facility] at [phone] with questions or concerns regarding your invoice.   Our billing staff will not be able to assist you with questions regarding bills from these companies.  You will be contacted with the lab results as soon as they are available. The fastest way to get your results is to activate your My Chart account. Instructions are located on the last page of this paperwork. If you have not heard from [place] regarding the results in 2 weeks, please contact this office.      Food Choices for Gastroesophageal Reflux Disease, Adult When you have gastroesophageal reflux disease (GERD), the foods you eat and your eating habits are very important. Choosing the right foods can help ease the discomfort of GERD. Consider working with a diet and nutrition specialist (dietitian) to help you make healthy food choices. What general guidelines should I follow? Eating plan  Choose healthy foods low in fat, such as fruits, vegetables, whole grains, low-fat dairy products, and lean meat, fish, and poultry.  Eat frequent, small meals instead of three large meals each day. Eat your meals slowly, in a relaxed setting. Avoid bending over or lying down until 2-3  hours after eating.  Limit high-fat foods such as fatty meats or fried foods.  Limit your intake of oils, butter, and shortening to less than 8 teaspoons each day.  Avoid the following: ? Foods that cause symptoms. These may be different for different people. Keep a food diary to keep track of foods that cause symptoms. ? Alcohol. ? Drinking large amounts of liquid with meals. ? Eating meals during the 2-3 hours before bed.  Cook foods using methods other than frying. This may include baking, grilling, or broiling. Lifestyle   Maintain a healthy weight. Ask your health care provider what weight is healthy for you. If you need to lose weight, work with your health care provider to do so safely.  Exercise for at least 30 minutes on 5 or more days each week, or as told by your health care provider.  Avoid wearing clothes that fit tightly around your waist and chest.  Do not use any products that contain nicotine or tobacco, such as cigarettes and e-cigarettes. If you need help quitting, ask your health care provider.  Sleep with the head of your bed raised. Use a wedge under the mattress or blocks under the bed frame to raise the head of the bed. What foods are not recommended? The items listed may not be a complete list. Talk with your dietitian about what dietary choices are best for you. Grains Pastries or quick breads with added fat. [place] toast. Vegetables Deep fried vegetables. [place] fries. Any vegetables prepared with added fat. Any vegetables that  cause symptoms. For some people this may include tomatoes and tomato products, chili peppers, onions and garlic, and horseradish. Fruits Any fruits prepared with added fat. Any fruits that cause symptoms. For some people this may include citrus fruits, such as oranges, grapefruit, pineapple, and lemons. Meats and other protein foods High-fat meats, such as fatty beef or pork, hot dogs, ribs, ham, sausage, salami and bacon. Fried  meat or protein, including fried fish and fried chicken. Nuts and nut butters. Dairy Whole milk and chocolate milk. Sour cream. Cream. Ice cream. Cream cheese. Milk shakes. Beverages Coffee and tea, with or without caffeine. Carbonated beverages. Sodas. Energy drinks. Fruit juice made with acidic fruits (such as orange or grapefruit). Tomato juice. Alcoholic drinks. Fats and oils Butter. Margarine. Shortening. Ghee. Sweets and desserts Chocolate and cocoa. Donuts. Seasoning and other foods Pepper. Peppermint and spearmint. Any condiments, herbs, or seasonings that cause symptoms. For some people, this may include curry, hot sauce, or vinegar-based salad dressings. Summary  When you have gastroesophageal reflux disease (GERD), food and lifestyle choices are very important to help ease the discomfort of GERD.  Eat frequent, small meals instead of three large meals each day. Eat your meals slowly, in a relaxed setting. Avoid bending over or lying down until 2-3 hours after eating.  Limit high-fat foods such as fatty meat or fried foods. This information is not intended to replace advice given to you by your health care provider. Make sure you discuss any questions you have with your health care provider. Document Released: 09/27/2005 Document Revised: 09/28/2016 Document Reviewed: 09/28/2016 Elsevier Interactive Patient Education  2017 Elsevier Inc.  Heartburn Heartburn is a type of pain or discomfort that can happen in the throat or chest. It is often described as a burning pain. It may also cause a bad taste in the mouth. Heartburn may feel worse when you lie down or bend over, and it is often worse at night. Heartburn may be caused by stomach contents that move back up into the esophagus (reflux). Follow these instructions at home: Take these actions to decrease your discomfort and to help avoid complications. Diet  Follow a diet as recommended by your health care provider. This may  involve avoiding foods and drinks such as: ? Coffee and tea (with or without caffeine). ? Drinks that contain alcohol. ? Energy drinks and sports drinks. ? Carbonated drinks or sodas. ? Chocolate and cocoa. ? Peppermint and mint flavorings. ? Garlic and onions. ? Horseradish. ? Spicy and acidic foods, including peppers, chili powder, curry powder, vinegar, hot sauces, and barbecue sauce. ? Citrus fruit juices and citrus fruits, such as oranges, lemons, and limes. ? Tomato-based foods, such as red sauce, chili, salsa, and pizza with red sauce. ? Fried and fatty foods, such as donuts, french fries, potato chips, and high-fat dressings. ? High-fat meats, such as hot dogs and fatty cuts of red and white meats, such as rib eye steak, sausage, ham, and bacon. ? High-fat dairy items, such as whole milk, butter, and cream cheese.  Eat small, frequent meals instead of large meals.  Avoid drinking large amounts of liquid with your meals.  Avoid eating meals during the 2-3 hours before bedtime.  Avoid lying down right after you eat.  Do not exercise right after you eat. General instructions  Pay attention to any changes in your symptoms.  Take over-the-counter and prescription medicines only as told by your health care provider. Do not take aspirin, ibuprofen, or other NSAIDs unless  your health care provider told you to do so.  Do not use any tobacco products, including cigarettes, chewing tobacco, and e-cigarettes. If you need help quitting, ask your health care provider.  Wear loose-fitting clothing. Do not wear anything tight around your waist that causes pressure on your abdomen.  Raise (elevate) the head of your bed about 6 inches (15 cm).  Try to reduce your stress, such as with yoga or meditation. If you need help reducing stress, ask your health care provider.  If you are overweight, reduce your weight to an amount that is healthy for you. Ask your health care provider for  guidance about a safe weight loss goal.  Keep all follow-up visits as told by your health care provider. This is important. Contact a health care provider if:  You have new symptoms.  You have unexplained weight loss.  You have difficulty swallowing, or it hurts to swallow.  You have wheezing or a persistent cough.  Your symptoms do not improve with treatment.  You have frequent heartburn for more than two weeks. Get help right away if:  You have pain in your arms, neck, jaw, teeth, or back.  You feel sweaty, dizzy, or light-headed.  You have chest pain or shortness of breath.  You vomit and your vomit looks like blood or coffee grounds.  Your stool is bloody or black. This information is not intended to replace advice given to you by your health care provider. Make sure you discuss any questions you have with your health care provider. Document Released: 02/13/2009 Document Revised: 03/04/2016 Document Reviewed: 01/22/2015 Elsevier Interactive Patient Education  2017 [place] Polycythemia vera (PV), or myeloproliferative disease, is a form of blood cancer in which the bone marrow makes too many (overproduces) red blood cells. The bone marrow may also make too many clotting cells (platelets) and white blood cells. Bone marrow is the spongy center of bones where blood cells are produced. Sometimes, there may be an overproduction of blood cells in the liver and spleen, causing those organs to become enlarged. Additionally, people who have PV are at a higher risk for stroke or heart attack because their blood may clot more easily. PV is a long-term disease. What are the causes? Almost all people who have PV have an abnormal gene (genetic mutation) that causes changes in the way that the bone marrow makes blood cells. This gene, which is called JAK2, is not passed along from parent to child (is not hereditary). It is not known what triggers the genetic mutation  that causes the body to produce too many red blood cells. What increases the risk? This condition is more likely to develop in:  Males.  People who are 68 years of age or older.  What are the signs or symptoms? You may not have any symptoms in the early stage of PV. When symptoms develop, they may include:  Shortness of breath.  Dizziness.  Hot and flushed skin.  Itchy skin.  Sweats, especially night sweats.  Headache.  Tiredness.  Ringing in the ears.  Blurred vision or blind spots.  Bone pain.  Weight loss.  Fever.  Blood-tinged vomit or bowel movements.  How is this diagnosed? This condition may be diagnosed during a routine physical exam if you have a blood test called a complete blood count (CBC). Your health care provider also may suspect PV if you have symptoms. During the physical exam, your provider may find that you have an enlarged  liver or spleen. You may also have tests to confirm the diagnosis. These may include:  A procedure to remove a sample of bone marrow for testing (bone marrow biopsy).  Blood tests to check for: ? The JAK2 gene. ? Low levels of a hormone that helps to regulate blood production (erythropoietin).  How is this treated? There is no cure for PV, but treatment can help to control the disease. There are several types of treatment. No single treatment works for everyone. You will need to work with a blood cancer specialist (hematologist) to find the treatment that is best for you. Options include:  Periodically having some blood removed with a needle (drawn) to lower the number of red blood cells (phlebotomy).  Medicine. Your health care provider may recommend: ? Low-dose aspirin to lower your risk for blood clots. ? A medicine to reduce red blood cell production (hydroxyurea). ? A medicine to lower the number of red blood cells (interferon). ? A medicine that slows down the effects of JAK2 (ruxolitinib).  Follow these  instructions at home:  Take over-the-counter and prescription medicines only as told by your health care provider.  Return to your normal activities as told by your health care provider. Ask your health care provider what activities are safe for you.  Do not use tobacco products, including cigarettes, chewing tobacco, or e-cigarettes. If you need help quitting, ask your health care provider.  Keep all follow-up visits as told by your health care provider. This is important. Contact a health care provider if:  You have side effects from your medicines.  Your symptoms change or get worse at home.  You have blood in your stool or you vomit blood. Get help right away if:  You have sudden and severe pain in your abdomen.  You have chest pain or difficulty breathing.  You have signs of stroke, such as: ? Sudden numbness. ? Weakness of your face or arm. ? Confusion. ? Difficulty speaking or understanding speech. These symptoms may represent a serious problem that is an emergency. Do not wait to see if the symptoms will go away. Get medical help right away. Call your local emergency services (911 in the [place] Do not drive yourself to the hospital. This information is not intended to replace advice given to you by your health care provider. Make sure you discuss any questions you have with your health care provider. Document Released: 06/22/2001 Document Revised: 03/04/2016 Document Reviewed: 04/09/2015 Elsevier Interactive Patient Education  [employer].

## 2017-07-06 NOTE — Progress Notes (Addendum)
Subjective:  By signing my name below, I, Michael Berger, attest that this documentation has been prepared under the direction and in the presence of Delman Cheadle, MD Electronically Signed: Ladene Artist, ED Scribe 07/06/2017 at 12:34 PM.   Patient ID: Michael Berger, male    DOB: 09-Apr-1961, 56 y.o.   MRN: [id number]  Chief Complaint  Patient presents with   Gastroesophageal Reflux    Acid reflux x2weeks    HPI Michael Berger is a 56 y.o. male who presents to Primary Care at [facility] complaining of acid reflux x 2 weeks. Pt was seen for acid reflux 8 months prior. It was being controlled by Prilosec and diet adjustments. He did complain of worsening symptoms when he took Cialis but wanting to continue on that daily.   Pt stopped Prilosec due to side-effects but states symptoms seemed to resolve for a while. He started taking Zantac in the morning which worked well for a while but started noticing that it did not sustain him throughout the day. He noticed flare-ups after each meals with Zantac with symptoms of burning sensation in his chest, nausea, vomiting food mixed with oily bright red blood, occasionally soft stools. States symptoms were exacerbated with alcohol consumption. Denies melena, blood in stools, abdominal pain, changes in urine. Pt smokes a cigar once a year. Denies second hand exposure. Has not been wearing his c-pap for several weeks due to symptoms. Pt also reports a tightening sensation in his chest with arm squeezing at rest that has resolved, and occasional light-headedness with standing too quickly which he attributes to a sinus infection that he had 2 weeks ago. Pt has an appointment with GI in November. He also reports that he has been taking testosterone for a while.   Past Medical History:  Diagnosis Date   Anxiety    BPH with obstruction/lower urinary tract symptoms    GERD (gastroesophageal reflux disease)    Hypertension    Sleep apnea    getting to start wearing  CPAP   Stress    Current Outpatient Prescriptions on File Prior to Visit  Medication Sig Dispense Refill   fluticasone (FLONASE) 50 MCG/ACT nasal spray Place 2 sprays into both nostrils at bedtime. 16 g 2   omeprazole (PRILOSEC) 20 MG capsule Take 1 capsule (20 mg total) by mouth daily. 30 minutes before a meal 90 capsule 3   tadalafil (CIALIS) 5 MG tablet Take 1 tablet (5 mg total) by mouth daily. 90 tablet 3   losartan-hydrochlorothiazide (HYZAAR) 100-25 MG tablet Take 1 tablet by mouth daily. 90 tablet 3   No current facility-administered medications on file prior to visit.    Allergies  Allergen Reactions   Other Rash    apples   Past Surgical History:  Procedure Laterality Date   APPENDECTOMY     Age 43   Family History  Problem Relation Age of Onset   Hypertension Mother    Colon cancer Neg Hx    Esophageal cancer Neg Hx    Stomach cancer Neg Hx    Rectal cancer Neg Hx    Social History   Social History   Marital status: Married    Spouse name: N/A   Number of children: 2   Years of education: N/A   Occupational History   Event organiser Unemployed    [place] History Main Topics   Smoking status: Never Smoker   Smokeless tobacco: Never Used     Comment: cigars-  once yearly   Alcohol use 0.0 oz/week     Comment: Rare; holidays   Drug use: No   Sexual activity: Not Asked   Other Topics Concern   None   Social History Narrative   Exercises 3 x's weekly.   Drinks coffee about 8 times a month   Depression screen [facility] 2/9 07/06/2017 10/18/2016 10/05/2016 12/02/2015 06/18/2015  Decreased Interest 0 0 0 0 0  Down, Depressed, Hopeless 0 0 0 0 0  PHQ - 2 Score 0 0 0 0 0    Review of Systems  Respiratory: Positive for chest tightness (resolved).   Gastrointestinal: Positive for nausea and vomiting. Negative for abdominal pain and blood in stool.  Genitourinary: Negative for hematuria.  Neurological: Positive for  light-headedness (occasional).      Objective:   Physical Exam  Constitutional: He is oriented to person, place, and time. He appears well-developed and well-nourished. No distress.  HENT:  Head: Normocephalic and atraumatic.  Eyes: Conjunctivae and EOM are normal.  Neck: Neck supple. No tracheal deviation present.  Cardiovascular: Normal rate.   Pulmonary/Chest: Effort normal. No respiratory distress.  Musculoskeletal: Normal range of motion.  Neurological: He is alert and oriented to person, place, and time.  Skin: Skin is warm and dry.  Psychiatric: He has a normal mood and affect. His behavior is normal.  Nursing note and vitals reviewed.  BP 134/86    Pulse 100    Temp (!) 97.5 F (36.4 C) (Oral)    Resp 18    Ht 5\' 11"  (1.803 m)    Wt 238 lb 3.2 oz (108 kg)    SpO2 96%    PF 490 L/min    BMI 33.22 kg/m  Predicted PF: 614    Orthostatic vitals neg/normal lying 138/94 97 Sitting 137/83 96 Standing 136/88 104  Results for orders placed or performed in visit on 07/06/17  POCT CBC  Result Value Ref Range   WBC 7.0 4.6 - 10.2 K/uL   Lymph, poc 2.5 0.6 - 3.4   POC LYMPH PERCENT 35.8 10 - 50 %L   MID (cbc) 0.1 0 - 0.9   POC MID % 2.0 0 - 12 %M   POC Granulocyte 4.4 2 - 6.9   Granulocyte percent 62.2 37 - 80 %G   RBC 6.53 (A) 4.69 - 6.13 M/uL   Hemoglobin 18.5 (A) 14.1 - 18.1 g/dL   HCT, POC 55.4 (A) 43.5 - 53.7 %   MCV 84.8 80 - 97 fL   MCH, POC 28.3 27 - 31.2 pg   MCHC 33.3 31.8 - 35.4 g/dL   RDW, POC 14.7 %   Platelet Count, POC 214 142 - 424 K/uL   MPV 7.4 0 - 99.8 fL   Dg Chest 2 View  Result Date: 07/06/2017 CLINICAL DATA:  Polycythemia, abnormal EKG. EXAM: CHEST  2 VIEW COMPARISON:  Chest x-ray of July 23, 2016 FINDINGS: The lungs are adequately inflated. There is no focal infiltrate. There is no pleural effusion. The heart and pulmonary vascularity are normal. The mediastinum is normal in width. There is tortuosity of the descending thoracic aorta. The bony  thorax exhibits no acute abnormality. IMPRESSION: There is no acute cardiopulmonary abnormality. Electronically Signed   By: David  Martinique M.D.   On: 07/06/2017 14:04   EKG reading done by Delman Cheadle, MD: Compared to 07/23/13 shows flipped T waves and AVL. Larger P and T waves and 2, 3 AVF with poor R wave AVF.  Assessment & Plan:   1. Gastroesophageal reflux disease, esophagitis presence not specified - restart ppi and alternate w/ drug holidays off and onto bid h2 blocker for 1-2 mos sev times a year if able. If sxs persist -> refer to GI.  2. Polycythemia - pt has actually been taking testosterone supp for the past yr or so which is likely etiology - will go off and recheck cbc in lab only visit in 2 mos.  Pt reports test supp has helped a lot with his energy and going to the gym - have not have level checked prior so will do fasting a.m. Lab in 2 mos after he has been of the [place] for that time.  3. Essential hypertension -  mildly above goal but maxed on losartan-chtz, add in amlodipine if not improved at f/u  4. Medication monitoring encounter   5. Hematemesis without nausea   6. Chest pain, unspecified type   7. Intermittent lightheadedness   8. OSA on CPAP - will increase compliance once gerd is trxed.     Orders Placed This Encounter  Procedures   DG Chest 2 View    Standing Status:   Future    Number of Occurrences:   1    Standing Expiration Date:   07/06/2018    Order Specific Question:   Reason for Exam (SYMPTOM  OR DIAGNOSIS REQUIRED)    Answer:   new/worsening polycythemia, EKG c/w pulmonary disease    Order Specific Question:   Preferred imaging location?    Answer:   External   Comprehensive metabolic panel   H. pylori breath test   Orthostatic vital signs   POCT CBC   EKG 12-Lead   EKG 12-Lead    Meds ordered this encounter  Medications   pantoprazole (PROTONIX) 40 MG tablet    Sig: Take 1 tablet (40 mg total) by mouth daily. 30 minutes before a meal      Dispense:  30 tablet    Refill:  8   Over 40 min spent in face-to-face evaluation of and consultation with patient and coordination of care.  Over 50% of this time was spent counseling this patient regarding chronic disease management, "health" supplements, poss contributers to slowly increase wbc, poss etiology of CP other than gerd and indications for further w/u.  Needs CPE - last 11/2015.  RTC fasting lab only visit in 2 mos,  CPE in 3 mos  I personally performed the services described in this documentation, which was scribed in my presence. The recorded information has been reviewed and considered, and addended by me as needed.   Delman Cheadle, M.D.  Primary Care at [facility] [address] [place], [place] [address] [phone] phone [phone] fax  07/08/17 9:18 AM

## 2017-07-07 ENCOUNTER — Telehealth: Payer: Self-pay | Admitting: Family Medicine

## 2017-07-07 LAB — COMPREHENSIVE METABOLIC PANEL
ALT: 33 IU/L (ref 0–44)
AST: 26 IU/L (ref 0–40)
Albumin/Globulin Ratio: 1.4 (ref 1.2–2.2)
Albumin: 5 g/dL (ref 3.5–5.5)
Alkaline Phosphatase: 61 IU/L (ref 39–117)
BUN/Creatinine Ratio: 13 (ref 9–20)
BUN: 18 mg/dL (ref 6–24)
Bilirubin Total: 0.4 mg/dL (ref 0.0–1.2)
CO2: 22 mmol/L (ref 20–29)
Calcium: 10.1 mg/dL (ref 8.7–10.2)
Chloride: 100 mmol/L (ref 96–106)
Creatinine, Ser: 1.34 mg/dL — ABNORMAL HIGH (ref 0.76–1.27)
GFR calc Af Amer: 69 mL/min/{1.73_m2} (ref >59)
GFR calc non Af Amer: 60 mL/min/{1.73_m2} (ref >59)
Globulin, Total: 3.6 g/dL (ref 1.5–4.5)
Glucose: 89 mg/dL (ref 65–99)
Potassium: 4 mmol/L (ref 3.5–5.2)
Sodium: 140 mmol/L (ref 134–144)
Total Protein: 8.6 g/dL — ABNORMAL HIGH (ref 6.0–8.5)

## 2017-07-07 NOTE — Telephone Encounter (Signed)
Pt has questions about he AVS and would like a call back   Best number [phone]

## 2017-07-07 NOTE — Telephone Encounter (Signed)
Pt has called again asking about his AVS he states that there is something listed as a disease the he is concerned about   Best [phone]

## 2017-07-07 NOTE — Telephone Encounter (Signed)
Pt requesting a call back on diagnosis listed on AVS. Sent to [place].

## 2017-07-08 LAB — H. PYLORI BREATH TEST: H pylori Breath Test: POSITIVE — AB

## 2017-07-08 NOTE — Telephone Encounter (Signed)
Called. Question about polycythemia. Reassured. Stopped test supp. Recheck fasting labs in 2 mos

## 2017-08-29 ENCOUNTER — Ambulatory Visit: Payer: Commercial Managed Care - HMO | Admitting: Gastroenterology

## 2017-10-22 ENCOUNTER — Encounter (HOSPITAL_COMMUNITY): Payer: Self-pay | Admitting: *Deleted

## 2017-10-22 ENCOUNTER — Emergency Department (HOSPITAL_COMMUNITY)
Admission: EM | Admit: 2017-10-22 | Discharge: 2017-10-22 | Disposition: A | Discharge status: Home / Self Care | Payer: 59 | Attending: Emergency Medicine | Admitting: Emergency Medicine

## 2017-10-22 DIAGNOSIS — Y9301 Activity, walking, marching and hiking: Secondary | ICD-10-CM | POA: Insufficient documentation

## 2017-10-22 DIAGNOSIS — S0181XA Laceration without foreign body of other part of head, initial encounter: Secondary | ICD-10-CM | POA: Insufficient documentation

## 2017-10-22 DIAGNOSIS — W0110XA Fall on same level from slipping, tripping and stumbling with subsequent striking against unspecified object, initial encounter: Secondary | ICD-10-CM | POA: Diagnosis not present

## 2017-10-22 DIAGNOSIS — I10 Essential (primary) hypertension: Secondary | ICD-10-CM | POA: Diagnosis not present

## 2017-10-22 DIAGNOSIS — Z79899 Other long term (current) drug therapy: Secondary | ICD-10-CM | POA: Diagnosis not present

## 2017-10-22 DIAGNOSIS — S060X0A Concussion without loss of consciousness, initial encounter: Secondary | ICD-10-CM | POA: Insufficient documentation

## 2017-10-22 DIAGNOSIS — Z23 Encounter for immunization: Secondary | ICD-10-CM | POA: Diagnosis not present

## 2017-10-22 DIAGNOSIS — Y999 Unspecified external cause status: Secondary | ICD-10-CM | POA: Insufficient documentation

## 2017-10-22 DIAGNOSIS — Y929 Unspecified place or not applicable: Secondary | ICD-10-CM | POA: Insufficient documentation

## 2017-10-22 DIAGNOSIS — S0993XA Unspecified injury of face, initial encounter: Secondary | ICD-10-CM | POA: Diagnosis present

## 2017-10-22 MED ORDER — LIDOCAINE HCL (PF) 1 % IJ SOLN
30.0000 mL | Freq: Once | INTRAMUSCULAR | Status: AC
  Administered 2017-10-22: 30 mL via INTRADERMAL
  Filled 2017-10-22: qty 30

## 2017-10-22 MED ORDER — TETANUS-DIPHTH-ACELL PERTUSSIS 5-2.5-18.5 LF-MCG/0.5 IM SUSP
0.5000 mL | Freq: Once | INTRAMUSCULAR | Status: AC
  Administered 2017-10-22: 0.5 mL via INTRAMUSCULAR
  Filled 2017-10-22: qty 0.5

## 2017-10-22 MED ORDER — LIDOCAINE-EPINEPHRINE-TETRACAINE (LET) SOLUTION
3.0000 mL | Freq: Once | NASAL | Status: AC
  Administered 2017-10-22: 3 mL via TOPICAL
  Filled 2017-10-22: qty 3

## 2017-10-22 MED ORDER — BUPIVACAINE HCL (PF) 0.5 % IJ SOLN
50.0000 mL | Freq: Once | INTRAMUSCULAR | Status: AC
  Administered 2017-10-22: 50 mL
  Filled 2017-10-22: qty 60

## 2017-10-22 NOTE — Discharge Instructions (Signed)
Keep wound dry and do not remove dressing for 24 hours if possible. After that, wash gently morning and night (every 12 hours) with soap and water. Use a topical antibiotic ointment and cover with a bandaid or gauze.    Do NOT use rubbing alcohol or hydrogen peroxide, do not soak the area   Present to your primary care doctor or the urgent care of your choice, or the ED for suture removal in 6-7 days.   Every attempt was made to remove foreign body (contaminants) from the wound.  However, there is always a chance that some may remain in the wound. This can  increase your risk of infection.   If you see signs of infection (warmth, redness, tenderness, pus, sharp increase in pain, fever, red streaking in the skin) immediately return to the emergency department.   After the wound heals fully, apply sunscreen for 6-12 months to minimize scarring.   Do not participate in any sports or any activities that could result in head trauma until you are cleared by your primary care physician or neurologist.

## 2017-10-22 NOTE — ED Triage Notes (Signed)
Pt was working outside when he tripped, fell forward and struck his forehead on concrete.  Per ems pt has a 4-5 inch lac to his forehead.  Pt has a dressing in place and bleeding appears controlled.  Pt has C-collar on by ems.  No LOC, no nausea.  PERRL, pt is alert and oriented.  Last tetanus shot unknown.

## 2017-10-22 NOTE — ED Notes (Signed)
Suture cart at bedside 
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
## 2017-10-27 ENCOUNTER — Other Ambulatory Visit: Payer: Self-pay | Admitting: *Deleted

## 2017-10-27 ENCOUNTER — Ambulatory Visit: Payer: 59 | Admitting: Urgent Care

## 2017-10-27 ENCOUNTER — Encounter: Payer: Self-pay | Admitting: Urgent Care

## 2017-10-27 VITALS — BP 137/83 | HR 93 | Temp 98.0°F | Resp 18 | Ht 72.0 in | Wt 245.2 lb

## 2017-10-27 DIAGNOSIS — S0181XD Laceration without foreign body of other part of head, subsequent encounter: Secondary | ICD-10-CM

## 2017-10-27 DIAGNOSIS — Z5189 Encounter for other specified aftercare: Secondary | ICD-10-CM | POA: Diagnosis not present

## 2017-10-27 DIAGNOSIS — S060X0D Concussion without loss of consciousness, subsequent encounter: Secondary | ICD-10-CM | POA: Diagnosis not present

## 2017-10-27 MED ORDER — TADALAFIL 5 MG PO TABS: 5.0000 mg | ORAL_TABLET | Freq: Every day | ORAL | 3 refills | Status: DC

## 2017-10-27 NOTE — Progress Notes (Unsigned)
Per Dr. Brigitte Pulse, patient requested a refill on Cialis.  He stated his insurance will only pay if it's the generic brand.  After speaking with Dr. Brigitte Pulse in the office, she gave the consent to have it refilled.  Patient was made aware before leaving the office.  Patient was informed he was given an 6 month supply and to call for additional refills.
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
## 2017-10-27 NOTE — Patient Instructions (Addendum)
Keep wound clean and dry. You may wash the wound gently, pat it dry. Do not put any creams, ointments until after your wound is fully healed and stitches are taken out.     Post-Concussion Syndrome Post-concussion syndrome is the symptoms that can occur after a head injury. These symptoms can last from weeks to months. Follow these instructions at home:  Take medicines only as told by your doctor.  Do not take aspirin.  Sleep with your head raised to help with headaches.  Avoid activities that can cause another head injury. ? Do not play contact sports like football, hockey, soccer, or basketball. ? Do not do other risky activities like downhill skiing, martial arts, or horseback riding until your doctor says it is okay.  Keep all follow-up visits as told by your doctor. This is important. Contact a doctor if:  You have a harder time: ? Paying attention. ? Focusing. ? Remembering. ? Learning new information. ? Dealing with stress.  You need more time to complete tasks.  You are easily bothered (irritable).  You have more symptoms. Get help if you have any of these symptoms for more than two weeks after your injury:  Long-lasting (chronic) headaches.  Dizziness.  Trouble balancing.  Feeling sick to your stomach (nauseous).  Trouble with your vision.  Noise or light bothers you more.  Depression.  Mood swings.  Feeling worried (anxious).  Easily bothered.  Memory problems.  Trouble concentrating or paying attention.  Sleep problems.  Feeling tired all of the time.  Get help right away if:  You feel confused.  You feel very sleepy.  You are hard to wake up.  You feel sick to your stomach.  You keep throwing up (vomiting).  You feel like you are moving when you are not (vertigo).  Your eyes move back and forth very quickly.  You start shaking (convulsing) or pass out (faint).  You have very bad headaches that do not get better with  medicine.  You cannot use your arms or legs like normal.  One of the black centers of your eyes (pupils) is bigger than the other.  You have clear or bloody fluid coming from your nose or ears.  Your problems get worse, not better. This information is not intended to replace advice given to you by your health care provider. Make sure you discuss any questions you have with your health care provider. Document Released: 11/04/2004 Document Revised: 03/04/2016 Document Reviewed: 01/02/2014 Elsevier Interactive Patient Education  2018 [employer].     IF you received an x-ray today, you will receive an invoice from [facility]. Please contact [facility] at [phone] with questions or concerns regarding your invoice.   IF you received labwork today, you will receive an invoice from [facility]. Please contact [facility] at [phone] with questions or concerns regarding your invoice.   Our billing staff will not be able to assist you with questions regarding bills from these companies.  You will be contacted with the lab results as soon as they are available. The fastest way to get your results is to activate your My Chart account. Instructions are located on the last page of this paperwork. If you have not heard from [place] regarding the results in 2 weeks, please contact this office.

## 2017-11-01 ENCOUNTER — Telehealth: Payer: Self-pay | Admitting: Family Medicine

## 2017-11-01 NOTE — Telephone Encounter (Signed)
Copied from [place] [phone]. Topic: Quick Communication - See Telephone Encounter >> Nov 01, 2017 12:30 PM Bea Graff, NT wrote: CRM for notification. See Telephone encounter for: Blanch Media from [facility] called and states PA needs to be completed for the rx tadalafil (CIALIS). CB#: [phone] Ref: [id number]  11/01/17.

## 2017-11-03 NOTE — Telephone Encounter (Signed)
Have not received any information on this patient.  Will review his chart, in order to proceed.

## 2017-11-07 NOTE — Telephone Encounter (Signed)
Looks like request was received by pharmacy.  Awaiting fax from them.

## 2017-11-16 ENCOUNTER — Other Ambulatory Visit: Payer: Self-pay

## 2017-11-16 ENCOUNTER — Encounter (HOSPITAL_COMMUNITY): Payer: Self-pay | Admitting: Nurse Practitioner

## 2017-11-16 ENCOUNTER — Emergency Department (HOSPITAL_COMMUNITY)
Admission: EM | Admit: 2017-11-16 | Discharge: 2017-11-16 | Disposition: A | Discharge status: Home / Self Care | Payer: Worker's Compensation | Attending: Emergency Medicine | Admitting: Emergency Medicine

## 2017-11-16 ENCOUNTER — Emergency Department (HOSPITAL_COMMUNITY): Payer: Worker's Compensation

## 2017-11-16 DIAGNOSIS — I10 Essential (primary) hypertension: Secondary | ICD-10-CM | POA: Insufficient documentation

## 2017-11-16 DIAGNOSIS — Y929 Unspecified place or not applicable: Secondary | ICD-10-CM | POA: Insufficient documentation

## 2017-11-16 DIAGNOSIS — Y990 Civilian activity done for income or pay: Secondary | ICD-10-CM | POA: Insufficient documentation

## 2017-11-16 DIAGNOSIS — Y9302 Activity, running: Secondary | ICD-10-CM | POA: Diagnosis not present

## 2017-11-16 DIAGNOSIS — Z79899 Other long term (current) drug therapy: Secondary | ICD-10-CM | POA: Diagnosis not present

## 2017-11-16 DIAGNOSIS — S82832A Other fracture of upper and lower end of left fibula, initial encounter for closed fracture: Secondary | ICD-10-CM | POA: Insufficient documentation

## 2017-11-16 DIAGNOSIS — S8992XA Unspecified injury of left lower leg, initial encounter: Secondary | ICD-10-CM | POA: Diagnosis present

## 2017-11-16 DIAGNOSIS — W010XXA Fall on same level from slipping, tripping and stumbling without subsequent striking against object, initial encounter: Secondary | ICD-10-CM | POA: Diagnosis not present

## 2017-11-16 MED ORDER — HYDROCODONE-ACETAMINOPHEN 5-325 MG PO TABS
1.0000 | ORAL_TABLET | Freq: Once | ORAL | Status: AC
  Administered 2017-11-16: 1 via ORAL
  Filled 2017-11-16: qty 1

## 2017-11-16 MED ORDER — HYDROCODONE-ACETAMINOPHEN 5-325 MG PO TABS: ORAL_TABLET | ORAL | 0 refills | Status: DC

## 2017-11-16 NOTE — Discharge Instructions (Signed)
Rest, Ice intermittently (in the first 24-48 hours), Gentle compression with an Ace wrap, and elevate (Limb above the level of the heart) °  °Take up to 800mg of ibuprofen (that is usually 4 over the counter pills)  3 times a day for 5 days. Take with food. ° °Take vicodin for breakthrough pain, do not drink alcohol, drive, care for children or do other critical tasks while taking vicodin. ° °Please follow with your primary care doctor in the next 2 days for a check-up. They must obtain records for further management.  ° °Do not hesitate to return to the Emergency Department for any new, worsening or concerning symptoms.  ° ° °
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
## 2017-11-16 NOTE — ED Notes (Signed)
Bed: HQ19 Expected date:  Expected time:  Means of arrival:  Comments: Patient back in the Waiting room

## 2017-11-21 ENCOUNTER — Encounter (INDEPENDENT_AMBULATORY_CARE_PROVIDER_SITE_OTHER): Payer: Self-pay | Admitting: Orthopaedic Surgery

## 2017-11-21 ENCOUNTER — Ambulatory Visit (INDEPENDENT_AMBULATORY_CARE_PROVIDER_SITE_OTHER): Payer: Worker's Compensation | Admitting: Orthopaedic Surgery

## 2017-11-21 DIAGNOSIS — S8262XA Displaced fracture of lateral malleolus of left fibula, initial encounter for closed fracture: Secondary | ICD-10-CM | POA: Insufficient documentation

## 2017-11-21 MED ORDER — HYDROCODONE-ACETAMINOPHEN 5-325 MG PO TABS: 1.0000 | ORAL_TABLET | Freq: Two times a day (BID) | ORAL | 0 refills | Status: DC | PRN

## 2017-11-21 NOTE — Progress Notes (Signed)
Office Visit Note   Patient: Michael Berger           Date of Birth: Dec 17, 1960           MRN: [id number] Visit Date: 11/21/2017              Requested by: Shawnee Knapp, MD [address] [place], [place] [address] PCP: Shawnee Knapp, MD   Assessment & Plan: Visit Diagnoses:  1. Closed displaced fracture of lateral malleolus of left fibula, initial encounter     Plan: Impression is displaced left lateral malleolus fracture.  His medial malleolus is nontender I think those are calcifications of previous injuries to his deltoid ligament.  We will assess his syndesmosis intraoperatively.  We discussed the risk benefits alternatives to surgery and he understands and wishes to proceed.  We need to do this this week given the timing of the injury. Total face to face encounter time was greater than 45 minutes and over half of this time was spent in counseling and/or coordination of care.  Follow-Up Instructions: Return in about 2 weeks (around 12/05/2017).   Orders:  No orders of the defined types were placed in this encounter.  Meds ordered this encounter  Medications  . HYDROcodone-acetaminophen (NORCO) 5-325 MG tablet    Sig: Take 1-2 tablets by mouth 2 (two) times daily as needed.    Dispense:  10 tablet    Refill:  0      Procedures: No procedures performed   Clinical Data: No additional findings.   Subjective: Chief Complaint  Patient presents with  . 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  His ankle was caught under the body and he had immediate onset of pain and deformity.  He was evaluated in the ED and given follow-up today.  He is nonweightbearing with crutches.  He does endorse swelling and pain.    Review of Systems  Constitutional: Negative.   All other systems reviewed and are negative.    Objective: Vital Signs: There were no vitals taken for this  visit.  Physical Exam  Constitutional: He is oriented to person, place, and time. He appears well-developed and well-nourished.  HENT:  Head: Normocephalic and atraumatic.  Eyes: Pupils are equal, round, and reactive to light.  Neck: Neck supple.  Pulmonary/Chest: Effort normal.  Abdominal: Soft.  Musculoskeletal: Normal range of motion.  Neurological: He is alert and oriented to person, place, and time.  Skin: Skin is warm.  Psychiatric: He has a normal mood and affect. His behavior is normal. Judgment and thought content normal.  Nursing note and vitals reviewed.   Ortho Exam Left ankle exam shows some mild swelling.  He is neurovascularly intact.  He does have tenderness over the distal fibula. Specialty Comments:  No specialty comments available.  Imaging: No results found.   PMFS History: Patient Active Problem List   Diagnosis Date Noted  . Closed fracture of left lateral malleolus 11/21/2017  . Intractable vomiting with nausea 10/05/2016  . GERD (gastroesophageal reflux disease) 12/02/2015  . OSA on CPAP 12/02/2015  . DDD (degenerative disc disease), cervical 12/02/2015  . DDD (degenerative disc disease), lumbosacral 12/02/2015  . Essential hypertension 04/28/2014  . Erectile dysfunction 04/26/2014  . Obesity, unspecified 07/06/2013   Past Medical History:  Diagnosis Date  . Anxiety   . BPH with obstruction/lower urinary tract symptoms   .  GERD (gastroesophageal reflux disease)   . Hypertension   . Sleep apnea    getting to start wearing CPAP  . Stress     Family History  Problem Relation Age of Onset  . Hypertension Mother   . Colon cancer Neg Hx   . Esophageal cancer Neg Hx   . Stomach cancer Neg Hx   . Rectal cancer Neg Hx     Past Surgical History:  Procedure Laterality Date  . APPENDECTOMY     Age 32   Social History   Occupational History  . 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 Smoking status: Never Smoker  . Smokeless tobacco: Never Used  . Tobacco comment: cigars- once yearly  Substance and Sexual Activity  . Alcohol use: Yes    Alcohol/week: 0.0 oz    Comment: Rare; holidays  . Drug use: No  . Sexual activity: Not on file

## 2017-11-22 ENCOUNTER — Encounter (HOSPITAL_BASED_OUTPATIENT_CLINIC_OR_DEPARTMENT_OTHER): Payer: Self-pay | Admitting: *Deleted

## 2017-11-22 ENCOUNTER — Other Ambulatory Visit: Payer: Self-pay

## 2017-11-22 ENCOUNTER — Telehealth (INDEPENDENT_AMBULATORY_CARE_PROVIDER_SITE_OTHER): Payer: Self-pay

## 2017-11-22 NOTE — Telephone Encounter (Signed)
Emailed the 11/21/17 office note and surgery request to adjustor

## 2017-11-23 ENCOUNTER — Ambulatory Visit (HOSPITAL_BASED_OUTPATIENT_CLINIC_OR_DEPARTMENT_OTHER)
Admission: RE | Admit: 2017-11-23 | Discharge: 2017-11-23 | Disposition: A | Discharge status: Home / Self Care | Payer: Worker's Compensation | Source: Ambulatory Visit | Attending: Orthopaedic Surgery | Admitting: Orthopaedic Surgery

## 2017-11-23 ENCOUNTER — Other Ambulatory Visit: Payer: Self-pay

## 2017-11-23 ENCOUNTER — Ambulatory Visit (HOSPITAL_BASED_OUTPATIENT_CLINIC_OR_DEPARTMENT_OTHER): Payer: Worker's Compensation | Admitting: Anesthesiology

## 2017-11-23 ENCOUNTER — Encounter (HOSPITAL_BASED_OUTPATIENT_CLINIC_OR_DEPARTMENT_OTHER)
Admission: RE | Disposition: A | Discharge status: Home / Self Care | Payer: Self-pay | Source: Ambulatory Visit | Attending: Orthopaedic Surgery

## 2017-11-23 ENCOUNTER — Encounter (HOSPITAL_BASED_OUTPATIENT_CLINIC_OR_DEPARTMENT_OTHER): Payer: Self-pay | Admitting: Anesthesiology

## 2017-11-23 ENCOUNTER — Ambulatory Visit (HOSPITAL_COMMUNITY): Payer: Worker's Compensation

## 2017-11-23 DIAGNOSIS — X58XXXA Exposure to other specified factors, initial encounter: Secondary | ICD-10-CM | POA: Insufficient documentation

## 2017-11-23 DIAGNOSIS — I10 Essential (primary) hypertension: Secondary | ICD-10-CM | POA: Diagnosis not present

## 2017-11-23 DIAGNOSIS — S82842A Displaced bimalleolar fracture of left lower leg, initial encounter for closed fracture: Secondary | ICD-10-CM | POA: Insufficient documentation

## 2017-11-23 DIAGNOSIS — N401 Enlarged prostate with lower urinary tract symptoms: Secondary | ICD-10-CM | POA: Insufficient documentation

## 2017-11-23 DIAGNOSIS — G4730 Sleep apnea, unspecified: Secondary | ICD-10-CM | POA: Diagnosis not present

## 2017-11-23 DIAGNOSIS — K219 Gastro-esophageal reflux disease without esophagitis: Secondary | ICD-10-CM | POA: Insufficient documentation

## 2017-11-23 DIAGNOSIS — Z4789 Encounter for other orthopedic aftercare: Secondary | ICD-10-CM

## 2017-11-23 DIAGNOSIS — Z79899 Other long term (current) drug therapy: Secondary | ICD-10-CM | POA: Insufficient documentation

## 2017-11-23 DIAGNOSIS — S8262XA Displaced fracture of lateral malleolus of left fibula, initial encounter for closed fracture: Secondary | ICD-10-CM | POA: Diagnosis not present

## 2017-11-23 HISTORY — DX: Displaced fracture of lateral malleolus of left fibula, initial encounter for closed fracture: S82.62XA

## 2017-11-23 HISTORY — PX: ORIF ANKLE FRACTURE: SHX5408

## 2017-11-23 LAB — POCT I-STAT, CHEM 8
BUN: 32 mg/dL — ABNORMAL HIGH (ref 6–20)
Calcium, Ion: 0.95 mmol/L — ABNORMAL LOW (ref 1.15–1.40)
Chloride: 104 mmol/L (ref 101–111)
Creatinine, Ser: 1.2 mg/dL (ref 0.61–1.24)
Glucose, Bld: 103 mg/dL — ABNORMAL HIGH (ref 65–99)
HCT: 57 % — ABNORMAL HIGH (ref 39.0–52.0)
Hemoglobin: 19.4 g/dL — ABNORMAL HIGH (ref 13.0–17.0)
Potassium: 5.5 mmol/L — ABNORMAL HIGH (ref 3.5–5.1)
Sodium: 137 mmol/L (ref 135–145)
TCO2: 26 mmol/L (ref 22–32)

## 2017-11-23 SURGERY — OPEN REDUCTION INTERNAL FIXATION (ORIF) ANKLE FRACTURE
Anesthesia: General | Site: Ankle | Laterality: Left

## 2017-11-23 MED ORDER — PROMETHAZINE HCL 25 MG PO TABS: 25.0000 mg | ORAL_TABLET | Freq: Four times a day (QID) | ORAL | 1 refills | Status: DC | PRN

## 2017-11-23 MED ORDER — PANTOPRAZOLE SODIUM 40 MG PO TBEC: 40.0000 mg | DELAYED_RELEASE_TABLET | Freq: Two times a day (BID) | ORAL | 0 refills | Status: DC

## 2017-11-23 MED ORDER — FENTANYL CITRATE (PF) 100 MCG/2ML IJ SOLN
INTRAMUSCULAR | Status: AC
  Filled 2017-11-23: qty 2

## 2017-11-23 MED ORDER — ASPIRIN EC 325 MG PO TBEC: 325.0000 mg | DELAYED_RELEASE_TABLET | Freq: Two times a day (BID) | ORAL | 0 refills | Status: DC

## 2017-11-23 MED ORDER — METOCLOPRAMIDE HCL 5 MG/ML IJ SOLN: 10.0000 mg | Freq: Once | INTRAMUSCULAR | Status: DC | PRN

## 2017-11-23 MED ORDER — OXYCODONE HCL 5 MG PO TABS: 5.0000 mg | ORAL_TABLET | ORAL | 0 refills | Status: DC | PRN

## 2017-11-23 MED ORDER — OXYCODONE HCL ER 10 MG PO T12A: 10.0000 mg | EXTENDED_RELEASE_TABLET | Freq: Two times a day (BID) | ORAL | 0 refills | Status: DC

## 2017-11-23 MED ORDER — ESMOLOL HCL 100 MG/10ML IV SOLN
INTRAVENOUS | Status: DC | PRN
  Administered 2017-11-23 (×2): 10 mg via INTRAVENOUS
  Administered 2017-11-23 (×2): 20 mg via INTRAVENOUS

## 2017-11-23 MED ORDER — FENTANYL CITRATE (PF) 100 MCG/2ML IJ SOLN
50.0000 ug | INTRAMUSCULAR | Status: DC | PRN
  Administered 2017-11-23: 100 ug via INTRAVENOUS

## 2017-11-23 MED ORDER — CEFAZOLIN SODIUM-DEXTROSE 2-4 GM/100ML-% IV SOLN
INTRAVENOUS | Status: AC
  Filled 2017-11-23: qty 100

## 2017-11-23 MED ORDER — PHENYLEPHRINE HCL 10 MG/ML IJ SOLN
INTRAMUSCULAR | Status: DC | PRN
  Administered 2017-11-23: 80 ug via INTRAVENOUS

## 2017-11-23 MED ORDER — CALCIUM CARBONATE-VITAMIN D 500-200 MG-UNIT PO TABS: 1.0000 | ORAL_TABLET | Freq: Three times a day (TID) | ORAL | 12 refills | Status: DC

## 2017-11-23 MED ORDER — CHLORHEXIDINE GLUCONATE 4 % EX LIQD: 60.0000 mL | Freq: Once | CUTANEOUS | Status: DC

## 2017-11-23 MED ORDER — LACTATED RINGERS IV SOLN: INTRAVENOUS | Status: DC

## 2017-11-23 MED ORDER — SENNOSIDES-DOCUSATE SODIUM 8.6-50 MG PO TABS: 1.0000 | ORAL_TABLET | Freq: Every evening | ORAL | 1 refills | Status: DC | PRN

## 2017-11-23 MED ORDER — CEFAZOLIN SODIUM-DEXTROSE 2-4 GM/100ML-% IV SOLN
2.0000 g | INTRAVENOUS | Status: AC
  Administered 2017-11-23: 2 g via INTRAVENOUS

## 2017-11-23 MED ORDER — ROPIVACAINE HCL 7.5 MG/ML IJ SOLN
INTRAMUSCULAR | Status: DC | PRN
  Administered 2017-11-23: 20 mL via PERINEURAL

## 2017-11-23 MED ORDER — LIDOCAINE HCL (CARDIAC) 20 MG/ML IV SOLN
INTRAVENOUS | Status: DC | PRN
  Administered 2017-11-23: 30 mg via INTRAVENOUS

## 2017-11-23 MED ORDER — ONDANSETRON HCL 4 MG PO TABS: 4.0000 mg | ORAL_TABLET | Freq: Three times a day (TID) | ORAL | 0 refills | Status: DC | PRN

## 2017-11-23 MED ORDER — MIDAZOLAM HCL 2 MG/2ML IJ SOLN
INTRAMUSCULAR | Status: AC
  Filled 2017-11-23: qty 2

## 2017-11-23 MED ORDER — PROPOFOL 10 MG/ML IV BOLUS
INTRAVENOUS | Status: AC
  Filled 2017-11-23: qty 20

## 2017-11-23 MED ORDER — PHENYLEPHRINE 40 MCG/ML (10ML) SYRINGE FOR IV PUSH (FOR BLOOD PRESSURE SUPPORT)
PREFILLED_SYRINGE | INTRAVENOUS | Status: AC
  Filled 2017-11-23: qty 10

## 2017-11-23 MED ORDER — MEPERIDINE HCL 25 MG/ML IJ SOLN: 6.2500 mg | INTRAMUSCULAR | Status: DC | PRN

## 2017-11-23 MED ORDER — FENTANYL CITRATE (PF) 100 MCG/2ML IJ SOLN: 25.0000 ug | INTRAMUSCULAR | Status: DC | PRN

## 2017-11-23 MED ORDER — MIDAZOLAM HCL 5 MG/5ML IJ SOLN
INTRAMUSCULAR | Status: DC | PRN
  Administered 2017-11-23: 2 mg via INTRAVENOUS

## 2017-11-23 MED ORDER — FENTANYL CITRATE (PF) 100 MCG/2ML IJ SOLN
INTRAMUSCULAR | Status: DC | PRN
  Administered 2017-11-23: 100 ug via INTRAVENOUS

## 2017-11-23 MED ORDER — SCOPOLAMINE 1 MG/3DAYS TD PT72: 1.0000 | MEDICATED_PATCH | Freq: Once | TRANSDERMAL | Status: DC | PRN

## 2017-11-23 MED ORDER — ESMOLOL HCL 100 MG/10ML IV SOLN
INTRAVENOUS | Status: AC
  Filled 2017-11-23: qty 10

## 2017-11-23 MED ORDER — PROPOFOL 10 MG/ML IV BOLUS
INTRAVENOUS | Status: DC | PRN
  Administered 2017-11-23: 200 mg via INTRAVENOUS

## 2017-11-23 MED ORDER — LACTATED RINGERS IV SOLN
INTRAVENOUS | Status: DC
  Administered 2017-11-23 (×2): via INTRAVENOUS

## 2017-11-23 MED ORDER — ZINC SULFATE 220 (50 ZN) MG PO CAPS: 220.0000 mg | ORAL_CAPSULE | Freq: Every day | ORAL | 0 refills | Status: DC

## 2017-11-23 MED ORDER — DEXAMETHASONE SODIUM PHOSPHATE 4 MG/ML IJ SOLN
INTRAMUSCULAR | Status: DC | PRN
  Administered 2017-11-23: 10 mg via INTRAVENOUS

## 2017-11-23 MED ORDER — ONDANSETRON HCL 4 MG/2ML IJ SOLN
INTRAMUSCULAR | Status: DC | PRN
  Administered 2017-11-23: 4 mg via INTRAVENOUS

## 2017-11-23 MED ORDER — LIDOCAINE 2% (20 MG/ML) 5 ML SYRINGE
INTRAMUSCULAR | Status: AC
  Filled 2017-11-23: qty 5

## 2017-11-23 MED ORDER — ONDANSETRON HCL 4 MG/2ML IJ SOLN
INTRAMUSCULAR | Status: AC
  Filled 2017-11-23: qty 2

## 2017-11-23 MED ORDER — CLARITHROMYCIN 500 MG PO TABS: 500.0000 mg | ORAL_TABLET | Freq: Two times a day (BID) | ORAL | 0 refills | Status: DC

## 2017-11-23 MED ORDER — DEXAMETHASONE SODIUM PHOSPHATE 10 MG/ML IJ SOLN
INTRAMUSCULAR | Status: AC
  Filled 2017-11-23: qty 1

## 2017-11-23 MED ORDER — MIDAZOLAM HCL 2 MG/2ML IJ SOLN
1.0000 mg | INTRAMUSCULAR | Status: DC | PRN
  Administered 2017-11-23: 2 mg via INTRAVENOUS

## 2017-11-23 MED ORDER — AMOXICILLIN 500 MG PO CAPS: 1000.0000 mg | ORAL_CAPSULE | Freq: Two times a day (BID) | ORAL | 0 refills | Status: DC

## 2017-11-23 SURGICAL SUPPLY — 79 items
BANDAGE ACE 4X5 VEL STRL LF (GAUZE/BANDAGES/DRESSINGS) IMPLANT
BANDAGE ACE 6X5 VEL STRL LF (GAUZE/BANDAGES/DRESSINGS) ×2 IMPLANT
BANDAGE ESMARK 6X9 LF (GAUZE/BANDAGES/DRESSINGS) ×1 IMPLANT
BIT DRILL 3.5X122MM AO FIT (BIT) ×2 IMPLANT
BLADE HEX COATED 2.75 (ELECTRODE) ×2 IMPLANT
BLADE SURG 15 STRL LF DISP TIS (BLADE) ×2 IMPLANT
BLADE SURG 15 STRL SS (BLADE) ×2
BNDG COHESIVE 6X5 TAN STRL LF (GAUZE/BANDAGES/DRESSINGS) ×2 IMPLANT
BNDG ESMARK 6X9 LF (GAUZE/BANDAGES/DRESSINGS) ×2
BRUSH SCRUB EZ PLAIN DRY (MISCELLANEOUS) ×2 IMPLANT
CANISTER SUCT 1200ML W/VALVE (MISCELLANEOUS) ×2 IMPLANT
COVER BACK TABLE 60X90IN (DRAPES) ×2 IMPLANT
COVER MAYO STAND STRL (DRAPES) IMPLANT
CUFF TOURNIQUET SINGLE 34IN LL (TOURNIQUET CUFF) IMPLANT
DECANTER SPIKE VIAL GLASS SM (MISCELLANEOUS) IMPLANT
DRAPE C-ARM 42X72 X-RAY (DRAPES) ×2 IMPLANT
DRAPE C-ARMOR (DRAPES) ×2 IMPLANT
DRAPE EXTREMITY T 121X128X90 (DRAPE) ×2 IMPLANT
DRAPE IMP U-DRAPE 54X76 (DRAPES) ×2 IMPLANT
DRAPE SURG 17X23 STRL (DRAPES) ×4 IMPLANT
DRILL 2.6X122MM WL AO SHAFT (BIT) ×2 IMPLANT
DRSG PAD ABDOMINAL 8X10 ST (GAUZE/BANDAGES/DRESSINGS) ×4 IMPLANT
DURAPREP 26ML APPLICATOR (WOUND CARE) ×4 IMPLANT
ELECT REM PT RETURN 9FT ADLT (ELECTROSURGICAL) ×2
ELECTRODE REM PT RTRN 9FT ADLT (ELECTROSURGICAL) ×1 IMPLANT
GAUZE SPONGE 4X4 12PLY STRL (GAUZE/BANDAGES/DRESSINGS) ×2 IMPLANT
GAUZE XEROFORM 1X8 LF (GAUZE/BANDAGES/DRESSINGS) ×2 IMPLANT
GLOVE BIO SURGEON STRL SZ 6.5 (GLOVE) ×2 IMPLANT
GLOVE BIOGEL PI IND STRL 7.0 (GLOVE) ×2 IMPLANT
GLOVE BIOGEL PI INDICATOR 7.0 (GLOVE) ×2
GLOVE SKINSENSE NS SZ7.5 (GLOVE) ×1
GLOVE SKINSENSE STRL SZ7.5 (GLOVE) ×1 IMPLANT
GLOVE SURG SYN 7.5  E (GLOVE) ×1
GLOVE SURG SYN 7.5 E (GLOVE) ×1 IMPLANT
GOWN STRL REIN XL XLG (GOWN DISPOSABLE) ×2 IMPLANT
GOWN STRL REUS W/ TWL LRG LVL3 (GOWN DISPOSABLE) ×1 IMPLANT
GOWN STRL REUS W/TWL LRG LVL3 (GOWN DISPOSABLE) ×1
K-WIRE ORTHOPEDIC 1.4X150L (WIRE) ×4
KWIRE ORTHOPEDIC 1.4X150L (WIRE) ×2 IMPLANT
MANIFOLD NEPTUNE II (INSTRUMENTS) ×2 IMPLANT
NEEDLE HYPO 22GX1.5 SAFETY (NEEDLE) IMPLANT
NS IRRIG 1000ML POUR BTL (IV SOLUTION) ×2 IMPLANT
PACK BASIN DAY SURGERY FS (CUSTOM PROCEDURE TRAY) ×2 IMPLANT
PAD CAST 3X4 CTTN HI CHSV (CAST SUPPLIES) IMPLANT
PAD CAST 4YDX4 CTTN HI CHSV (CAST SUPPLIES) IMPLANT
PADDING CAST COTTON 3X4 STRL (CAST SUPPLIES)
PADDING CAST COTTON 4X4 STRL (CAST SUPPLIES)
PADDING CAST COTTON 6X4 STRL (CAST SUPPLIES) ×4 IMPLANT
PADDING CAST SYN 6 (CAST SUPPLIES) ×1
PADDING CAST SYNTHETIC 4 (CAST SUPPLIES) ×1
PADDING CAST SYNTHETIC 4X4 STR (CAST SUPPLIES) ×1 IMPLANT
PADDING CAST SYNTHETIC 6X4 NS (CAST SUPPLIES) ×1 IMPLANT
PENCIL BUTTON HOLSTER BLD 10FT (ELECTRODE) ×2 IMPLANT
PLATE 7H 96MM (Plate) ×2 IMPLANT
SCREW BONE 14MMX3.5MM (Screw) ×6 IMPLANT
SCREW BONE 18 (Screw) ×2 IMPLANT
SCREW BONE 3.5X20MM (Screw) ×2 IMPLANT
SCREW LOCKING 3.5X12 (Screw) ×2 IMPLANT
SHEET MEDIUM DRAPE 40X70 STRL (DRAPES) ×2 IMPLANT
SLEEVE SCD COMPRESS KNEE MED (MISCELLANEOUS) ×2 IMPLANT
SPLINT FIBERGLASS 4X30 (CAST SUPPLIES) ×2 IMPLANT
SPONGE LAP 18X18 X RAY DECT (DISPOSABLE) ×2 IMPLANT
SUCTION FRAZIER HANDLE 10FR (MISCELLANEOUS) ×1
SUCTION TUBE FRAZIER 10FR DISP (MISCELLANEOUS) ×1 IMPLANT
SUT ETHILON 3 0 PS 1 (SUTURE) IMPLANT
SUT VIC AB 0 CT1 27 (SUTURE)
SUT VIC AB 0 CT1 27XBRD ANBCTR (SUTURE) IMPLANT
SUT VIC AB 2-0 CT1 27 (SUTURE) ×1
SUT VIC AB 2-0 CT1 TAPERPNT 27 (SUTURE) ×1 IMPLANT
SUT VIC AB 3-0 SH 27 (SUTURE)
SUT VIC AB 3-0 SH 27X BRD (SUTURE) IMPLANT
SYR BULB 3OZ (MISCELLANEOUS) ×2 IMPLANT
SYR CONTROL 10ML LL (SYRINGE) IMPLANT
TOWEL OR 17X24 6PK STRL BLUE (TOWEL DISPOSABLE) ×2 IMPLANT
TOWEL OR NON WOVEN STRL DISP B (DISPOSABLE) ×2 IMPLANT
TRAY DSU PREP LF (CUSTOM PROCEDURE TRAY) ×2 IMPLANT
TUBE CONNECTING 20X1/4 (TUBING) ×2 IMPLANT
UNDERPAD 30X30 (UNDERPADS AND DIAPERS) ×2 IMPLANT
YANKAUER SUCT BULB TIP NO VENT (SUCTIONS) ×2 IMPLANT

## 2017-11-23 NOTE — H&P (Signed)
PREOPERATIVE H&P  Chief Complaint: left lateral malleolus ankle fracture  HPI: Michael Berger is a 57 y.o. male who presents for surgical treatment of left lateral malleolus ankle fracture.  He denies any changes in medical history.  Past Medical History:  Diagnosis Date  . Anxiety   . BPH with obstruction/lower urinary tract symptoms   . Closed fracture of left lateral malleolus   . GERD (gastroesophageal reflux disease)   . Hypertension   . Sleep apnea    does not wear CPAP  . Stress    Past Surgical History:  Procedure Laterality Date  . APPENDECTOMY     Age 66   Social History   Socioeconomic History  . Marital status: Married    Spouse name: None  . Number of children: 2  . Years of education: None  . Highest education level: None  Social Needs  . Financial resource strain: None  . Food insecurity - worry: None  . Food insecurity - inability: None  . Transportation needs - medical: None  . Transportation needs - non-medical: None  Occupational History  . 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 Smoking status: Never Smoker  . Smokeless tobacco: Never Used  . Tobacco comment: cigars- once yearly  Substance and Sexual Activity  . Alcohol use: Yes    Alcohol/week: 0.0 oz    Comment: Rare; holidays  . Drug use: No  . Sexual activity: None  Other Topics Concern  . None  Social History Narrative   Exercises 3 x's weekly.   Drinks coffee about 8 times a month   Family History  Problem Relation Age of Onset  . Hypertension Mother   . Colon cancer Neg Hx   . Esophageal cancer Neg Hx   . Stomach cancer Neg Hx   . Rectal cancer Neg Hx    Allergies  Allergen Reactions  . Other Rash    apples   Prior to Admission medications   Medication Sig Start Date End Date Taking? Authorizing Provider  HYDROcodone-acetaminophen (NORCO) 5-325 MG tablet Take 1-2 tablets by mouth 2 (two) times daily as  needed. 11/21/17  Yes Leandrew Koyanagi, MD  losartan-hydrochlorothiazide (HYZAAR) 100-25 MG tablet Take 1 tablet by mouth daily. 10/18/16 11/22/17 Yes Shawnee Knapp, MD  pantoprazole (PROTONIX) 40 MG tablet Take 1 tablet (40 mg total) by mouth daily. 30 minutes before a meal 07/06/17  Yes Shawnee Knapp, MD  tadalafil (CIALIS) 5 MG tablet Take 1 tablet (5 mg total) by mouth daily. 10/27/17  Yes Shawnee Knapp, MD  HYDROcodone-acetaminophen (NORCO/VICODIN) 5-325 MG tablet Take 1-2 tablets by mouth every 6 hours as needed for pain and/or cough. 11/16/17   Pisciotta, Elmyra Ricks, PA-C     Positive ROS: All other systems have been reviewed and were otherwise negative with the exception of those mentioned in the HPI and as above.  Physical Exam: General: Alert, no acute distress Cardiovascular: No pedal edema Respiratory: No cyanosis, no use of accessory musculature GI: abdomen soft Skin: No lesions in the area of chief complaint Neurologic: Sensation intact distally Psychiatric: Patient is competent for consent with normal mood and affect Lymphatic: no lymphedema  MUSCULOSKELETAL: exam stable  Assessment: left lateral malleolus ankle fracture  Plan: Plan for Procedure(s): OPEN REDUCTION INTERNAL FIXATION (ORIF) LEFT LATERAL MALLEOLUS  The risks benefits and alternatives were discussed with the patient including but not limited to the risks  of nonoperative treatment, versus surgical intervention including infection, bleeding, nerve injury,  blood clots, cardiopulmonary complications, morbidity, mortality, among others, and they were willing to proceed.   Eduard Roux, MD   11/23/2017 8:29 AM

## 2017-11-23 NOTE — Transfer of Care (Signed)
Immediate Anesthesia Transfer of Care Note  Patient: Michael Berger  Procedure(s) Performed: OPEN REDUCTION INTERNAL FIXATION (ORIF) LEFT LATERAL MALLEOLUS (Left Ankle)  Patient Location: PACU  Anesthesia Type:GA combined with regional for post-op pain  Level of Consciousness: sedated  Airway & Oxygen Therapy: Patient Spontanous Breathing and Patient connected to face mask oxygen  Post-op Assessment: Report given to RN and Post -op Vital signs reviewed and stable  Post vital signs: Reviewed and stable  Last Vitals:  Vitals:   11/23/17 1145 11/23/17 1150  BP: 132/83 137/89  Pulse: 87 92  Resp: 15 16  Temp:    SpO2: 100% 100%    Last Pain:  Vitals:   11/23/17 1130  TempSrc:   PainSc: Asleep      Patients Stated Pain Goal: 3 (12/23/92 5859)  Complications: No apparent anesthesia complications

## 2017-11-23 NOTE — Anesthesia Procedure Notes (Signed)
Procedure Name: LMA Insertion Date/Time: 11/23/2017 12:18 PM Performed by: Marrianne Mood, CRNA Pre-anesthesia Checklist: Patient identified, Emergency Drugs available, Suction available, Patient being monitored and Timeout performed Patient Re-evaluated:Patient Re-evaluated prior to induction Oxygen Delivery Method: Circle system utilized Preoxygenation: Pre-oxygenation with 100% oxygen Induction Type: IV induction Ventilation: Mask ventilation without difficulty LMA: LMA inserted LMA Size: 5.0 Number of attempts: 1 Airway Equipment and Method: Bite block Placement Confirmation: positive ETCO2 Tube secured with: Tape Dental Injury: Teeth and Oropharynx as per pre-operative assessment

## 2017-11-23 NOTE — Anesthesia Postprocedure Evaluation (Signed)
Anesthesia Post Note  Patient: Michael Berger  Procedure(s) Performed: OPEN REDUCTION INTERNAL FIXATION (ORIF) LEFT LATERAL MALLEOLUS (Left Ankle)     Patient location during evaluation: PACU Anesthesia Type: General Level of consciousness: awake and alert Pain management: pain level controlled Vital Signs Assessment: post-procedure vital signs reviewed and stable Respiratory status: spontaneous breathing, nonlabored ventilation, respiratory function stable and patient connected to nasal cannula oxygen Cardiovascular status: blood pressure returned to baseline and stable Postop Assessment: no apparent nausea or vomiting Anesthetic complications: no    Last Vitals:  Vitals:   11/23/17 1400 11/23/17 1406  BP: 124/89   Pulse: 89 87  Resp: 13 14  Temp:    SpO2: 94% 98%    Last Pain:  Vitals:   11/23/17 1400  TempSrc:   PainSc: 0-No pain                 Montez Hageman

## 2017-11-23 NOTE — Anesthesia Preprocedure Evaluation (Signed)
Anesthesia Evaluation  Patient identified by MRN, date of birth, ID band Patient awake    Reviewed: Allergy & Precautions, NPO status , Patient's Chart, lab work & pertinent test results  Airway Mallampati: II  TM Distance: >3 FB Neck ROM: Full    Dental no notable dental hx.    Pulmonary sleep apnea and Continuous Positive Airway Pressure Ventilation ,    Pulmonary exam normal breath sounds clear to auscultation       Cardiovascular hypertension, Pt. on medications Normal cardiovascular exam Rhythm:Regular Rate:Normal     Neuro/Psych negative neurological ROS  negative psych ROS   GI/Hepatic Neg liver ROS, GERD  Medicated and Controlled,  Endo/Other  negative endocrine ROS  Renal/GU negative Renal ROS  negative genitourinary   Musculoskeletal negative musculoskeletal ROS (+)   Abdominal   Peds negative pediatric ROS (+)  Hematology negative hematology ROS (+)   Anesthesia Other Findings   Reproductive/Obstetrics negative OB ROS                             Anesthesia Physical Anesthesia Plan  ASA: II  Anesthesia Plan: General   Post-op Pain Management: GA combined w/ Regional for post-op pain   Induction: Intravenous  PONV Risk Score and Plan: 2 and Ondansetron and Treatment may vary due to age or medical condition  Airway Management Planned: LMA  Additional Equipment:   Intra-op Plan:   Post-operative Plan:   Informed Consent: I have reviewed the patients History and Physical, chart, labs and discussed the procedure including the risks, benefits and alternatives for the proposed anesthesia with the patient or authorized representative who has indicated his/her understanding and acceptance.   Dental advisory given  Plan Discussed with: CRNA  Anesthesia Plan Comments:         Anesthesia Quick Evaluation

## 2017-11-23 NOTE — Anesthesia Procedure Notes (Addendum)
Anesthesia Regional Block: Popliteal block   Pre-Anesthetic Checklist: ,, timeout performed, Correct Patient, Correct Site, Correct Laterality, Correct Procedure, Correct Position, site marked, Risks and benefits discussed,  Surgical consent,  Pre-op evaluation,  At surgeon's request and post-op pain management  Laterality: Left and Lower  Prep: Maximum Sterile Barrier Precautions used, chloraprep       Needles:  Injection technique: Single-shot  Needle Type: Echogenic Stimulator Needle     Needle Length: 10cm      Additional Needles:   Procedures:,,,, ultrasound used (permanent image in chart),,,,  Narrative:  Start time: 11/23/2017 11:26 AM End time: 11/23/2017 11:36 AM Injection made incrementally with aspirations every 5 mL.  Performed by: Personally  Anesthesiologist: Montez Hageman, MD  Additional Notes: Risks, benefits and alternative to block explained extensively.  Patient tolerated procedure well, without complications.

## 2017-11-23 NOTE — Progress Notes (Addendum)
Assisted Dr. Marcell Barlow with left, ultrasound guided, popliteal block. Side rails up, monitors on throughout procedure. See vital signs in flow sheet. Tolerated Procedure well. Block procedure finished at 1130 hrs.

## 2017-11-23 NOTE — Discharge Instructions (Signed)
    1. Keep splint clean and dry 2. Elevate foot above level of the heart 3. Take aspirin to prevent blood clots 4. Take pain meds as needed 5. Strict non weight bearing to operative extremity   Post Anesthesia Home Care Instructions  Activity: Get plenty of rest for the remainder of the day. A responsible individual must stay with you for 24 hours following the procedure.  For the next 24 hours, DO NOT: -Drive a car -Operate machinery -Drink alcoholic beverages -Take any medication unless instructed by your physician -Make any legal decisions or sign important papers.  Meals: Start with liquid foods such as gelatin or soup. Progress to regular foods as tolerated. Avoid greasy, spicy, heavy foods. If nausea and/or vomiting occur, drink only clear liquids until the nausea and/or vomiting subsides. Call your physician if vomiting continues.  Special Instructions/Symptoms: Your throat may feel dry or sore from the anesthesia or the breathing tube placed in your throat during surgery. If this causes discomfort, gargle with warm salt water. The discomfort should disappear within 24 hours.  If you had a scopolamine patch placed behind your ear for the management of post- operative nausea and/or vomiting:  1. The medication in the patch is effective for 72 hours, after which it should be removed.  Wrap patch in a tissue and discard in the trash. Wash hands thoroughly with soap and water. 2. You may remove the patch earlier than 72 hours if you experience unpleasant side effects which may include dry mouth, dizziness or visual disturbances. 3. Avoid touching the patch. Wash your hands with soap and water after contact with the patch.  Regional Anesthesia Blocks  1. Numbness or the inability to move the "blocked" extremity may last from 3-48 hours after placement. The length of time depends on the medication injected and your individual response to the medication. If the numbness is not  going away after 48 hours, call your surgeon.  2. The extremity that is blocked will need to be protected until the numbness is gone and the  Strength has returned. Because you cannot feel it, you will need to take extra care to avoid injury. Because it may be weak, you may have difficulty moving it or using it. You may not know what position it is in without looking at it while the block is in effect.  3. For blocks in the legs and feet, returning to weight bearing and walking needs to be done carefully. You will need to wait until the numbness is entirely gone and the strength has returned. You should be able to move your leg and foot normally before you try and bear weight or walk. You will need someone to be with you when you first try to ensure you do not fall and possibly risk injury.  4. Bruising and tenderness at the needle site are common side effects and will resolve in a few days.  5. Persistent numbness or new problems with movement should be communicated to the surgeon or the [facility] ([phone])/ [facility] ([phone]).      

## 2017-11-23 NOTE — Op Note (Addendum)
Date of Surgery: 11/23/2017  INDICATIONS: Michael Berger is a 58 y.o.-year-old male who sustained a left ankle fracture; he was indicated for open reduction and internal fixation due to the displaced nature of the articular fracture and came to the operating room today for this procedure. The patient did consent to the procedure after discussion of the risks and benefits.  PREOPERATIVE DIAGNOSIS: left bimalleolar equivalent ankle fracture  POSTOPERATIVE DIAGNOSIS: Same.  PROCEDURE: Open treatment of left ankle fracture with internal fixation. Bimalleolar CPT [phone]  SURGEON: N. Eduard Roux, M.D.  ASSIST: Ciro Backer [place], [place]; necessary for the timely completion of procedure and due to complexity of procedure.  ANESTHESIA:  general, regional  TOURNIQUET TIME: see anesthesia record  IV FLUIDS AND URINE: See anesthesia.  ESTIMATED BLOOD LOSS: minimal mL.  IMPLANTS: [employer]  COMPLICATIONS: None.  DESCRIPTION OF PROCEDURE: The patient was brought to the operating room and placed supine on the operating table.  The patient had been signed prior to the procedure and this was documented. The patient had the anesthesia placed by the anesthesiologist.  A nonsterile tourniquet was placed on the upper thigh.  The prep verification and incision time-outs were performed to confirm that this was the correct patient, site, side and location. The patient had an SCD on the opposite lower extremity. The patient did receive antibiotics prior to the incision and was re-dosed during the procedure as needed at indicated intervals.  The patient had the lower extremity prepped and draped in the standard surgical fashion.  The extremity was exsanguinated using an esmarch bandage and the tourniquet was inflated to 300 mm Hg.  An incision was created over the lateral aspect of the distal fibula.  Dissection was carried down to the fascia.  This was sharply incised in line with the incision.  Subperiosteal  elevation was then performed.  The fracture was exposed.  Organized hematoma was removed from the fracture site.  The fracture had a long spiral orientation which was amenable to lag fixation.  The fracture was then reduced and clamped.  An anterior to posterior lag screw was then placed using standard AO technique using fluoroscopic guidance.  A clamp was removed.  The fracture stayed reduced.  We then placed a semitubular plate on the lateral aspect of the fibula at the appropriate position using fluoroscopic guidance.  We placed 3 nonlocking screws proximal to the fracture using standard AO technique each with excellent purchase.  We then placed a bicortical nonlocking screw distal to the fracture with excellent purchase.  We then placed a unicortical locking screw distal to that screw in the distal portion of the lateral malleolus.  The avulsion fracture of the medial malleolus reduced itself after fixation of the fibula.  After this was done a stress exam of the ankle showed no widening of the medial clear space.  Final x-rays were taken.  The wounds were then thoroughly irrigated and closed in layered fashion using 0 Vicryl, 2-0 Vicryl, 3-0 nylon.  Sterile dressings were applied.  Leg was placed in a short leg splint in neutral dorsiflexion.  Patient tolerated the procedure well had no immediate complications.  POSTOPERATIVE PLAN: Michael Berger will remain nonweightbearing on this leg for approximately 6 weeks; Michael Berger will return for suture removal in 2 weeks.  He will be immobilized in a short leg splint and then transitioned to a CAM walker at his first follow up appointment.  Michael Berger will receive DVT prophylaxis based on other medications, activity  level, and risk ratio of bleeding to thrombosis.  Azucena Cecil, MD [facility] [phone] 12:06 PM

## 2017-11-23 NOTE — Addendum Note (Signed)
Addended by: Shawnee Knapp on: 11/23/2017 04:10 PM   Modules accepted: Orders

## 2017-11-24 ENCOUNTER — Encounter (HOSPITAL_BASED_OUTPATIENT_CLINIC_OR_DEPARTMENT_OTHER): Payer: Self-pay | Admitting: Orthopaedic Surgery

## 2017-11-24 ENCOUNTER — Encounter (INDEPENDENT_AMBULATORY_CARE_PROVIDER_SITE_OTHER): Payer: Self-pay

## 2017-11-24 ENCOUNTER — Telehealth (INDEPENDENT_AMBULATORY_CARE_PROVIDER_SITE_OTHER): Payer: Self-pay

## 2017-11-24 NOTE — Telephone Encounter (Signed)
Please send work note to [facility], thanks.  Abigail Butts holding for [employer] for Rx for scooter.

## 2017-11-24 NOTE — Telephone Encounter (Signed)
Work comp adj needs work status for patient and rx for scooter that was given to pt yesterday so she can get for pt

## 2017-11-24 NOTE — Telephone Encounter (Signed)
Can you advise on how long OOW?   We need you get you to write another rx for a scooter Friday AM so we can send it to [facility].

## 2017-11-24 NOTE — Telephone Encounter (Signed)
Emailed Coralyn Mark Flowers the work note and advised that I would get the scooter rx to her in the morning

## 2017-11-24 NOTE — Telephone Encounter (Signed)
8-12 weeks OOW.

## 2017-11-25 ENCOUNTER — Ambulatory Visit: Payer: Self-pay | Admitting: *Deleted

## 2017-11-25 NOTE — Telephone Encounter (Signed)
Michael Berger, patient was given a Rx for a scooter at discharge.  WC needs a copy, can you get Dr Erlinda Hong to write another one and give it to Amy B so she can send to [facility]?  Thanks.

## 2017-11-25 NOTE — Telephone Encounter (Signed)
RX made, will give Rx original copy to Amy B.

## 2017-11-25 NOTE — Telephone Encounter (Signed)
Emailed rx to [email or web address]

## 2017-11-26 NOTE — Telephone Encounter (Signed)
LMOVM for pt with Dr. Raul Del message from [facility] test Sept 2018 - see her notes on that lab test result.

## 2017-11-28 ENCOUNTER — Telehealth (INDEPENDENT_AMBULATORY_CARE_PROVIDER_SITE_OTHER): Payer: Self-pay | Admitting: Orthopaedic Surgery

## 2017-11-28 ENCOUNTER — Other Ambulatory Visit (INDEPENDENT_AMBULATORY_CARE_PROVIDER_SITE_OTHER): Payer: Self-pay

## 2017-11-28 MED ORDER — HYDROCODONE-ACETAMINOPHEN 5-325 MG PO TABS: ORAL_TABLET | ORAL | 0 refills | Status: DC

## 2017-11-28 NOTE — Telephone Encounter (Signed)
Okay for spouse Rollan Roger to pick up RX per patient.

## 2017-11-28 NOTE — Telephone Encounter (Signed)
Patient called stating that he only has 1 pill left of his oxycodone and is wondering if he should get a refill since he is still experiencing pain. CB # [phone]

## 2017-11-28 NOTE — Telephone Encounter (Signed)
Ok to give norco 5mg , one tab po bid prn pain.  #20, no refills.  Use sparingly

## 2017-11-28 NOTE — Telephone Encounter (Signed)
Rx ready for pick up. Patient aware.

## 2017-11-28 NOTE — Telephone Encounter (Signed)
See message below °

## 2017-11-28 NOTE — Telephone Encounter (Signed)
Patient called to question medications.  I discussed with him to finish the asa regimen, #28 days, 1po qd.  I discussed pain meds as well.  He has 13 tablets oxycodone 5 mg left, he will take those 1 po q 4 hr prn.  He will have his wife come pickup the norco rx tomorrow, and he will take those as needed once oxycodone is gone.  He understands NOT to take the oxycodone and norco together.

## 2017-12-01 ENCOUNTER — Telehealth (INDEPENDENT_AMBULATORY_CARE_PROVIDER_SITE_OTHER): Payer: Self-pay | Admitting: Orthopaedic Surgery

## 2017-12-01 NOTE — Telephone Encounter (Signed)
Patient is needing an application for a handicap plaque.  CB#[phone].  Thank you.

## 2017-12-02 NOTE — Telephone Encounter (Signed)
6 months

## 2017-12-02 NOTE — Telephone Encounter (Signed)
Called patient  No answer LMOM Handicap form ready for pickup here in our office

## 2017-12-02 NOTE — Telephone Encounter (Signed)
See message below. If approved, for how many months?

## 2017-12-08 ENCOUNTER — Ambulatory Visit (INDEPENDENT_AMBULATORY_CARE_PROVIDER_SITE_OTHER): Payer: Worker's Compensation | Admitting: Orthopaedic Surgery

## 2017-12-08 ENCOUNTER — Encounter (INDEPENDENT_AMBULATORY_CARE_PROVIDER_SITE_OTHER): Payer: Self-pay

## 2017-12-08 ENCOUNTER — Ambulatory Visit (INDEPENDENT_AMBULATORY_CARE_PROVIDER_SITE_OTHER): Payer: Worker's Compensation

## 2017-12-08 DIAGNOSIS — S8262XD Displaced fracture of lateral malleolus of left fibula, subsequent encounter for closed fracture with routine healing: Secondary | ICD-10-CM

## 2017-12-08 NOTE — Progress Notes (Signed)
Patient is two-week status post fixation of left ankle fracture.  He is overall doing well.  Pain is well controlled.  Incision is healed.  The sutures removed today.  Continue nonweightbearing.  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  Continue nonweightbearing Cam walker and a knee scooter.  Follow-up in 4 weeks with three-view x-rays of the left ankle.

## 2017-12-12 ENCOUNTER — Telehealth (INDEPENDENT_AMBULATORY_CARE_PROVIDER_SITE_OTHER): Payer: Self-pay

## 2017-12-12 NOTE — Telephone Encounter (Signed)
Emailed pts 12/08/17 office and work note to wc adj per her request

## 2017-12-15 ENCOUNTER — Other Ambulatory Visit: Payer: Self-pay | Admitting: Family Medicine

## 2017-12-15 DIAGNOSIS — R112 Nausea with vomiting, unspecified: Secondary | ICD-10-CM

## 2017-12-22 ENCOUNTER — Telehealth: Payer: Self-pay | Admitting: Family Medicine

## 2017-12-22 NOTE — Telephone Encounter (Signed)
Copied from [place]. Topic: Quick Communication - Rx Refill/Question >> Dec 22, 2017  4:48 PM Neva Seat wrote: Cialis 5mg   Prior Authorization for quantity - submit as an urgent request since pt has been without Rx since Jan.   [place] -  call for prior authorization

## 2017-12-23 ENCOUNTER — Encounter (INDEPENDENT_AMBULATORY_CARE_PROVIDER_SITE_OTHER): Payer: Self-pay | Admitting: Physician Assistant

## 2017-12-23 ENCOUNTER — Ambulatory Visit (INDEPENDENT_AMBULATORY_CARE_PROVIDER_SITE_OTHER): Payer: Worker's Compensation | Admitting: Physician Assistant

## 2017-12-23 ENCOUNTER — Ambulatory Visit (INDEPENDENT_AMBULATORY_CARE_PROVIDER_SITE_OTHER): Payer: Self-pay

## 2017-12-23 DIAGNOSIS — S8262XD Displaced fracture of lateral malleolus of left fibula, subsequent encounter for closed fracture with routine healing: Secondary | ICD-10-CM

## 2017-12-23 MED ORDER — MUPIROCIN 2 % EX OINT: TOPICAL_OINTMENT | CUTANEOUS | 0 refills | Status: DC

## 2017-12-23 NOTE — Telephone Encounter (Signed)
Pt called back in for an update. I advised him that Dr Brigitte Pulse was out of the office today and would be back tomorrow. He said "  I have been without them since January " He said please make sure its for 30 pills

## 2017-12-23 NOTE — Telephone Encounter (Signed)
FYI

## 2017-12-23 NOTE — Progress Notes (Signed)
Post-Op Visit Note   Patient: Michael Berger           Date of Birth: November 10, 1960           MRN: [id number] Visit Date: 12/23/2017 PCP: Shawnee Knapp, MD   Assessment & Plan:  Chief Complaint:  Chief Complaint  Patient presents with  . Left Ankle - Wound Check   Visit Diagnoses:  1. Closed displaced fracture of lateral malleolus of left fibula with routine healing, subsequent encounter     Plan: [name] comes in with concerns about the left ankle.  30 days status post ORIF lateral malleolus fracture, date of surgery 11/23/17.  He has been in a cam walker non-weightbearing utilizing a knee scooter.  He recently noticed an area to the most proximal aspect of this incision that looks similar to a pimple.  When he pressed it a tiny bit of blood was expressed.  No further drainage.  Note increased tenderness.  No fevers or chills.  Examination of his left ankle reveals a well-healed surgical incision.  It does appear that he may have a suture abscess to the most proximal aspect.  No tenderness and no drainage on my exam.  No evidence of infection or cellulitis.  He is neurovascular intact distally.  Today, we will apply mupirocin ointment with a Band-Aid.  I will also call in a prescription for this.  He will do this twice daily for the next 1-2 weeks.  He will follow-up with [place] in 2 weeks time for three-view x-rays of the left ankle.  He will call with concerns or questions in the meantime.  Follow-Up Instructions: Return in about 2 weeks (around 01/06/2018).   Orders:  Orders Placed This Encounter  Procedures  . XR Ankle Complete Left   No orders of the defined types were placed in this encounter.   Imaging: No results found.  PMFS History: Patient Active Problem List   Diagnosis Date Noted  . Closed fracture of left lateral malleolus 11/21/2017  . Intractable vomiting with nausea 10/05/2016  . GERD (gastroesophageal reflux disease) 12/02/2015  . OSA on CPAP 12/02/2015  . DDD  (degenerative disc disease), cervical 12/02/2015  . DDD (degenerative disc disease), lumbosacral 12/02/2015  . Essential hypertension 04/28/2014  . Erectile dysfunction 04/26/2014  . Obesity, unspecified 07/06/2013   Past Medical History:  Diagnosis Date  . Anxiety   . BPH with obstruction/lower urinary tract symptoms   . Closed fracture of left lateral malleolus   . GERD (gastroesophageal reflux disease)   . Hypertension   . Sleep apnea    does not wear CPAP  . Stress     Family History  Problem Relation Age of Onset  . Hypertension Mother   . Colon cancer Neg Hx   . Esophageal cancer Neg Hx   . Stomach cancer Neg Hx   . Rectal cancer Neg Hx     Past Surgical History:  Procedure Laterality Date  . APPENDECTOMY     Age 27  . ORIF ANKLE FRACTURE Left 11/23/2017   Procedure: OPEN REDUCTION INTERNAL FIXATION (ORIF) LEFT LATERAL MALLEOLUS;  Surgeon: Leandrew Koyanagi, MD;  Location: [facility];  Service: Orthopedics;  Laterality: Left;   Social History   Occupational History  . 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 Smoking status: Never Smoker  . Smokeless tobacco: Never Used  . Tobacco comment: cigars- once yearly  Substance and Sexual Activity  . Alcohol use: Yes    Alcohol/week: 0.0 oz    Comment: Rare; holidays  . Drug use: No  . Sexual activity: Not on file

## 2017-12-24 NOTE — Telephone Encounter (Signed)
Called patient to let know his PA was approved for 1 year.   They will only do a 30 day supply if filled at pharmacy.  If he needs 90 day he will have to go through mail order.  Patient voiced understanding

## 2018-01-02 MED ORDER — TADALAFIL 5 MG PO TABS: 5.0000 mg | ORAL_TABLET | Freq: Every day | ORAL | 5 refills | Status: DC

## 2018-01-02 NOTE — Addendum Note (Signed)
Addended by: Shawnee Knapp on: 01/02/2018 12:41 AM   Modules accepted: Orders

## 2018-01-05 ENCOUNTER — Ambulatory Visit (INDEPENDENT_AMBULATORY_CARE_PROVIDER_SITE_OTHER): Payer: Worker's Compensation | Admitting: Orthopaedic Surgery

## 2018-01-05 ENCOUNTER — Ambulatory Visit (INDEPENDENT_AMBULATORY_CARE_PROVIDER_SITE_OTHER): Payer: Worker's Compensation

## 2018-01-05 DIAGNOSIS — S8262XD Displaced fracture of lateral malleolus of left fibula, subsequent encounter for closed fracture with routine healing: Secondary | ICD-10-CM

## 2018-01-05 NOTE — Progress Notes (Signed)
   Post-Op Visit Note   Patient: Michael Berger           Date of Birth: 1961-06-03           MRN: [id number] Visit Date: 01/05/2018 PCP: Shawnee Knapp, MD   Assessment & Plan:  Chief Complaint:  Chief Complaint  Patient presents with  . Left Ankle - Routine Post Op   Visit Diagnoses:  1. Closed displaced fracture of lateral malleolus of left fibula with routine healing     Plan: Patient is 6 weeks status post left lateral malleolus fracture.  He is overall doing well.  He does endorse mild swelling.  The surgical scar is fully healed.  Pain is well controlled.  At this point we will advance him to weight-bear as tolerated in a Cam walker.  Referral to physical therapy.  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  Follow-up in 6 weeks with three-view x-rays of left ankle.  Patient in agreement with the plan.  Follow-Up Instructions: Return in about 6 weeks (around 02/16/2018).   Orders:  Orders Placed This Encounter  Procedures  . XR Ankle Complete Left   No orders of the defined types were placed in this encounter.   Imaging: Xr Ankle Complete Left  Result Date: 01/05/2018 Stable fixation of lateral malleolus fracture.  No complications.  Demonstrating bony healing   PMFS History: Patient Active Problem List   Diagnosis Date Noted  . Closed fracture of left lateral malleolus 11/21/2017  . Intractable vomiting with nausea 10/05/2016  . GERD (gastroesophageal reflux disease) 12/02/2015  . OSA on CPAP 12/02/2015  . DDD (degenerative disc disease), cervical 12/02/2015  . DDD (degenerative disc disease), lumbosacral 12/02/2015  . Essential hypertension 04/28/2014  . Erectile dysfunction 04/26/2014  . Obesity, unspecified 07/06/2013   Past Medical History:  Diagnosis Date  . Anxiety   . BPH with obstruction/lower urinary tract symptoms   . Closed fracture of left lateral  malleolus   . GERD (gastroesophageal reflux disease)   . Hypertension   . Sleep apnea    does not wear CPAP  . Stress     Family History  Problem Relation Age of Onset  . Hypertension Mother   . Colon cancer Neg Hx   . Esophageal cancer Neg Hx   . Stomach cancer Neg Hx   . Rectal cancer Neg Hx     Past Surgical History:  Procedure Laterality Date  . APPENDECTOMY     Age 38  . ORIF ANKLE FRACTURE Left 11/23/2017   Procedure: OPEN REDUCTION INTERNAL FIXATION (ORIF) LEFT LATERAL MALLEOLUS;  Surgeon: Leandrew Koyanagi, MD;  Location: [facility];  Service: Orthopedics;  Laterality: Left;   Social History   Occupational History  . 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 Smoking status: Never Smoker  . Smokeless tobacco: Never Used  . Tobacco comment: cigars- once yearly  Substance and Sexual Activity  . Alcohol use: Yes    Alcohol/week: 0.0 oz    Comment: Rare; holidays  . Drug use: No  . Sexual activity: Not on file

## 2018-01-17 ENCOUNTER — Telehealth (INDEPENDENT_AMBULATORY_CARE_PROVIDER_SITE_OTHER): Payer: Self-pay

## 2018-01-17 NOTE — Telephone Encounter (Signed)
Faxed

## 2018-01-17 NOTE — Telephone Encounter (Signed)
Maranda with [facility] would like OP note faxed to [phone].  Stated that patient is currently at the office for appt.  Cb# is [phone].  Please advise.  Thank you.

## 2018-01-26 ENCOUNTER — Telehealth (INDEPENDENT_AMBULATORY_CARE_PROVIDER_SITE_OTHER): Payer: Self-pay

## 2018-01-26 NOTE — Telephone Encounter (Signed)
yes

## 2018-01-26 NOTE — Telephone Encounter (Signed)
[facility] faxed [place] a sheet.   "He has been progressed to ambulating in the CAM Boot without AD. He is doing well and would benefit from progressing to ambulating in a regular shoe with a lace up brace for further rehab benefit."   Okay to give patient brace?

## 2018-01-30 ENCOUNTER — Encounter (INDEPENDENT_AMBULATORY_CARE_PROVIDER_SITE_OTHER): Payer: Self-pay

## 2018-01-30 ENCOUNTER — Ambulatory Visit (INDEPENDENT_AMBULATORY_CARE_PROVIDER_SITE_OTHER): Payer: Worker's Compensation

## 2018-01-30 DIAGNOSIS — S8262XA Displaced fracture of lateral malleolus of left fibula, initial encounter for closed fracture: Secondary | ICD-10-CM

## 2018-01-30 NOTE — Telephone Encounter (Signed)
Called patient states he will come by to pick up brace.

## 2018-01-30 NOTE — Progress Notes (Signed)
Patient came in to get fitted for an ASO Brace.

## 2018-02-13 ENCOUNTER — Encounter (INDEPENDENT_AMBULATORY_CARE_PROVIDER_SITE_OTHER): Payer: Self-pay | Admitting: Orthopaedic Surgery

## 2018-02-13 ENCOUNTER — Ambulatory Visit (INDEPENDENT_AMBULATORY_CARE_PROVIDER_SITE_OTHER): Payer: Worker's Compensation

## 2018-02-13 ENCOUNTER — Ambulatory Visit (INDEPENDENT_AMBULATORY_CARE_PROVIDER_SITE_OTHER): Payer: Worker's Compensation | Admitting: Orthopaedic Surgery

## 2018-02-13 DIAGNOSIS — S8262XD Displaced fracture of lateral malleolus of left fibula, subsequent encounter for closed fracture with routine healing: Secondary | ICD-10-CM

## 2018-02-13 NOTE — Progress Notes (Signed)
Post-Op Visit Note   Patient: Michael Berger           Date of Birth: April 08, 1961           MRN: [id number] Visit Date: 02/13/2018 PCP: Shawnee Knapp, MD   Assessment & Plan:  Chief Complaint:  Chief Complaint  Patient presents with  . Left Ankle - Routine Post Op    11/23/17 ORIF left malleolus   Visit Diagnoses:  1. Closed displaced fracture of lateral malleolus of left fibula with routine healing, subsequent encounter     Plan: Patient is 82 days status post ORIF left ankle lateral malleolus fracture.  He is progressing with physical therapy although he still has some weakness.  He has several remaining visits left.  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  His surgical scar is fully healed.  His range of motion is coming along as well as some strength.  No evidence of infection.  His x-rays show evidence of healing.  At this point I want him to continue with physical therapy and then work conditioning after he finishes physical therapy and then eventually an FCE.  I would like to see him back in 8 weeks with repeat three-view x-rays of the left ankle.  Follow-Up Instructions: Return in about 8 weeks (around 04/10/2018).   Orders:  Orders Placed This Encounter  Procedures  . XR Ankle Complete Left  . Ambulatory referral to Physical Therapy   No orders of the defined types were placed in this encounter.   Imaging: Xr Ankle Complete Left  Result Date: 02/13/2018 Stable fixation of lateral malleolus fracture demonstrating healing.  No complications.   PMFS History: Patient Active Problem List   Diagnosis Date Noted  . Closed fracture of left lateral malleolus 11/21/2017  . Intractable vomiting with nausea 10/05/2016  . GERD (gastroesophageal reflux disease) 12/02/2015  . OSA on CPAP 12/02/2015  . DDD (degenerative disc disease), cervical 12/02/2015  . DDD (degenerative disc disease), lumbosacral  12/02/2015  . Essential hypertension 04/28/2014  . Erectile dysfunction 04/26/2014  . Obesity, unspecified 07/06/2013   Past Medical History:  Diagnosis Date  . Anxiety   . BPH with obstruction/lower urinary tract symptoms   . Closed fracture of left lateral malleolus   . GERD (gastroesophageal reflux disease)   . Hypertension   . Sleep apnea    does not wear CPAP  . Stress     Family History  Problem Relation Age of Onset  . Hypertension Mother   . Colon cancer Neg Hx   . Esophageal cancer Neg Hx   . Stomach cancer Neg Hx   . Rectal cancer Neg Hx     Past Surgical History:  Procedure Laterality Date  . APPENDECTOMY     Age 57  . ORIF ANKLE FRACTURE Left 11/23/2017   Procedure: OPEN REDUCTION INTERNAL FIXATION (ORIF) LEFT LATERAL MALLEOLUS;  Surgeon: Leandrew Koyanagi, MD;  Location: [facility];  Service: Orthopedics;  Laterality: Left;   Social History   Occupational History  . 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 Smoking status: Never Smoker  . Smokeless tobacco: Never Used  . Tobacco comment: cigars- once yearly  Substance and Sexual Activity  . Alcohol use: Yes    Alcohol/week: 0.0 oz    Comment: Rare; holidays  . Drug  use: No  . Sexual activity: Not on file

## 2018-03-13 ENCOUNTER — Other Ambulatory Visit: Payer: Self-pay

## 2018-03-13 ENCOUNTER — Encounter: Payer: Self-pay | Admitting: Physician Assistant

## 2018-03-13 ENCOUNTER — Ambulatory Visit (INDEPENDENT_AMBULATORY_CARE_PROVIDER_SITE_OTHER): Payer: 59 | Admitting: Physician Assistant

## 2018-03-13 VITALS — BP 110/70 | HR 97 | Temp 99.0°F | Resp 16 | Ht 72.0 in | Wt 233.8 lb

## 2018-03-13 DIAGNOSIS — R0989 Other specified symptoms and signs involving the circulatory and respiratory systems: Secondary | ICD-10-CM

## 2018-03-13 DIAGNOSIS — J069 Acute upper respiratory infection, unspecified: Secondary | ICD-10-CM

## 2018-03-13 MED ORDER — ALBUTEROL SULFATE (2.5 MG/3ML) 0.083% IN NEBU
2.5000 mg | INHALATION_SOLUTION | Freq: Once | RESPIRATORY_TRACT | Status: AC
  Administered 2018-03-13: 2.5 mg via RESPIRATORY_TRACT

## 2018-03-13 MED ORDER — NAPROXEN 500 MG PO TABS: 500.0000 mg | ORAL_TABLET | Freq: Two times a day (BID) | ORAL | 0 refills | Status: DC

## 2018-03-13 MED ORDER — BENZONATATE 100 MG PO CAPS: 100.0000 mg | ORAL_CAPSULE | Freq: Two times a day (BID) | ORAL | 0 refills | Status: DC | PRN

## 2018-03-13 MED ORDER — HYDROCODONE-HOMATROPINE 5-1.5 MG/5ML PO SYRP: 2.5000 mL | ORAL_SOLUTION | Freq: Every evening | ORAL | 0 refills | Status: DC | PRN

## 2018-03-13 MED ORDER — CETIRIZINE-PSEUDOEPHEDRINE ER 5-120 MG PO TB12: 1.0000 | ORAL_TABLET | Freq: Two times a day (BID) | ORAL | 0 refills | Status: AC

## 2018-03-13 MED ORDER — IPRATROPIUM BROMIDE 0.02 % IN SOLN
0.5000 mg | Freq: Once | RESPIRATORY_TRACT | Status: AC
  Administered 2018-03-13: 0.5 mg via RESPIRATORY_TRACT

## 2018-03-13 NOTE — Patient Instructions (Addendum)
Please come back and if you are getting worse.  I expect you to make a full recovery within the next 6 to 10 days.    IF you received an x-ray today, you will receive an invoice from [facility]. Please contact [facility] at [phone] with questions or concerns regarding your invoice.   IF you received labwork today, you will receive an invoice from [facility]. Please contact [facility] at [phone] with questions or concerns regarding your invoice.   Our billing staff will not be able to assist you with questions regarding bills from these companies.  You will be contacted with the lab results as soon as they are available. The fastest way to get your results is to activate your My Chart account. Instructions are located on the last page of this paperwork. If you have not heard from [place] regarding the results in 2 weeks, please contact this office.

## 2018-03-13 NOTE — Progress Notes (Signed)
03/13/2018 9:19 AM   DOB: 1961-02-20 / MRN: [id number]  SUBJECTIVE:  Michael Berger is a 57 y.o. male presenting for 3 days of worsening nonproductive cough.  Associates sore throat which resolved about 24 hours ago.  Has lots of nasal congestion today.  Exposed to his barber who was "coughing all over him."   Current Outpatient Medications:  .  losartan-hydrochlorothiazide (HYZAAR) 100-25 MG tablet, Take 1 tablet by mouth daily., Disp: 90 tablet, Rfl: 2 .  pantoprazole (PROTONIX) 40 MG tablet, Take 1 tablet (40 mg total) by mouth 2 (two) times daily., Disp: 60 tablet, Rfl: 0 .  tadalafil (CIALIS) 5 MG tablet, Take 1 tablet (5 mg total) by mouth daily., Disp: 30 tablet, Rfl: 5   He is allergic to other.   He  has a past medical history of Anxiety, BPH with obstruction/lower urinary tract symptoms, Closed fracture of left lateral malleolus, GERD (gastroesophageal reflux disease), Hypertension, Sleep apnea, and Stress.    He  reports that he has never smoked. He has never used smokeless tobacco. He reports that he drinks alcohol. He reports that he does not use drugs. He  has no sexual activity history on file. The patient  has a past surgical history that includes Appendectomy and ORIF ankle fracture (Left, 11/23/2017).  His family history includes Hypertension in his mother.  Review of Systems  Constitutional: Negative for chills, diaphoresis and fever.  Eyes: Negative.   Respiratory: Positive for cough. Negative for hemoptysis, sputum production, shortness of breath and wheezing.   Cardiovascular: Negative for chest pain, orthopnea and leg swelling.  Gastrointestinal: Negative for nausea.  Skin: Negative for rash.  Neurological: Negative for dizziness, sensory change, speech change, focal weakness and headaches.    The problem list and medications were reviewed and updated by myself where necessary and exist elsewhere in the encounter.   OBJECTIVE:  BP 110/70   Pulse 97   Temp  99 F (37.2 C)   Resp 16   Ht 6' (1.829 m)   Wt 233 lb 12.8 oz (106.1 kg)   SpO2 97%   BMI 31.71 kg/m   Wt Readings from Last 3 Encounters:  03/13/18 233 lb 12.8 oz (106.1 kg)  11/23/17 248 lb (112.5 kg)  11/16/17 245 lb (111.1 kg)   Temp Readings from Last 3 Encounters:  03/13/18 99 F (37.2 C)  11/23/17 97.6 F (36.4 C) (Oral)  11/16/17 97.9 F (36.6 C) (Oral)   BP Readings from Last 3 Encounters:  03/13/18 110/70  11/23/17 (!) 157/97  11/16/17 (!) 150/97   Pulse Readings from Last 3 Encounters:  03/13/18 97  11/23/17 84  11/16/17 94     Physical Exam  Constitutional: He is oriented to person, place, and time. He appears well-developed. He does not appear ill.  Eyes: Pupils are equal, round, and reactive to light. Conjunctivae and EOM are normal.  Cardiovascular: Normal rate, regular rhythm, S1 normal, S2 normal, normal heart sounds, intact distal pulses and normal pulses. Exam reveals no gallop and no friction rub.  No murmur heard. Pulmonary/Chest: Effort normal. No stridor. No respiratory distress. He has no wheezes. He has no rales.  Abdominal: He exhibits no distension.  Musculoskeletal: Normal range of motion. He exhibits no edema.  Neurological: He is alert and oriented to person, place, and time. No cranial nerve deficit. Coordination normal.  Skin: Skin is warm and dry. He is not diaphoretic.  Psychiatric: He has a normal mood and affect.  Nursing note  and vitals reviewed.   No results found for this or any previous visit (from the past 72 hour(s)).  No results found.  ASSESSMENT AND PLAN:  Kale was seen today for uri.  Diagnoses and all orders for this visit:  URI with cough and congestion: Mild rhonchi on left lung however no rales vitals benign.  HPI consistent with a viral etiology.  Will treat that hand.  Advised to come back if he becomes worse. -     naproxen (NAPROSYN) 500 MG tablet; Take 1 tablet (500 mg total) by mouth 2 (two) times  daily with a meal. -     cetirizine-pseudoephedrine (ZYRTEC-D) 5-120 MG tablet; Take 1 tablet by mouth 2 (two) times daily for 7 days. -     HYDROcodone-homatropine (HYCODAN) 5-1.5 MG/5ML syrup; Take 2.5-5 mLs by mouth at bedtime as needed. -     benzonatate (TESSALON) 100 MG capsule; Take 1 capsule (100 mg total) by mouth 2 (two) times daily as needed for cough.  Rhonchi -     albuterol (PROVENTIL) (2.5 MG/3ML) 0.083% nebulizer solution 2.5 mg -     ipratropium (ATROVENT) nebulizer solution 0.5 mg    The patient is advised to call or return to clinic if he does not see an improvement in symptoms, or to seek the care of the closest emergency department if he worsens with the above plan.   Philis Fendt, MHS, PA-C [facility] 03/13/2018 9:19 AM

## 2018-04-18 ENCOUNTER — Ambulatory Visit (INDEPENDENT_AMBULATORY_CARE_PROVIDER_SITE_OTHER): Payer: Worker's Compensation | Admitting: Orthopaedic Surgery

## 2018-04-18 ENCOUNTER — Encounter (INDEPENDENT_AMBULATORY_CARE_PROVIDER_SITE_OTHER): Payer: Self-pay | Admitting: Orthopaedic Surgery

## 2018-04-18 ENCOUNTER — Ambulatory Visit (INDEPENDENT_AMBULATORY_CARE_PROVIDER_SITE_OTHER): Payer: Self-pay | Admitting: Orthopaedic Surgery

## 2018-04-18 ENCOUNTER — Ambulatory Visit (INDEPENDENT_AMBULATORY_CARE_PROVIDER_SITE_OTHER): Payer: Worker's Compensation

## 2018-04-18 ENCOUNTER — Telehealth (INDEPENDENT_AMBULATORY_CARE_PROVIDER_SITE_OTHER): Payer: Self-pay | Admitting: Orthopaedic Surgery

## 2018-04-18 ENCOUNTER — Other Ambulatory Visit (INDEPENDENT_AMBULATORY_CARE_PROVIDER_SITE_OTHER): Payer: Self-pay

## 2018-04-18 DIAGNOSIS — S8262XD Displaced fracture of lateral malleolus of left fibula, subsequent encounter for closed fracture with routine healing: Secondary | ICD-10-CM

## 2018-04-18 NOTE — Progress Notes (Signed)
Office Visit Note   Patient: Michael Berger           Date of Birth: 1961-05-21           MRN: [id number] Visit Date: 04/18/2018              Requested by: Shawnee Knapp, MD [address] [place], [place] [address] PCP: Shawnee Knapp, MD   Assessment & Plan: Visit Diagnoses:  1. Closed displaced fracture of lateral malleolus of left fibula with routine healing, subsequent encounter     Plan: Impression is 5 months status post left ankle ORIF.  At this point I would like to send the patient for an FCE so that we can right and release him.  He is to continue to use ASO brace as needed.  He may increase activity as tolerated.  Questions encouraged and answered.  We will see him back once he has obtained the FCE. Total face to face encounter time was greater than 25 minutes and over half of this time was spent in counseling and/or coordination of care.  Follow-Up Instructions: Return if symptoms worsen or fail to improve.   Orders:  Orders Placed This Encounter  Procedures  . XR Ankle Complete Left   No orders of the defined types were placed in this encounter.     Procedures: No procedures performed   Clinical Data: No additional findings.   Subjective: Chief Complaint  Patient presents with  . Left Ankle - Routine Post Op, Follow-up    Patient is for ORIF left ankle fracture.  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  He is now working with the [employer].  He does endorse soreness and cracking popping and swelling with increased activity and difficulty going downstairs.  He cannot walk long distances.  He has finished physical therapy but work conditioning due to the fact that he retired from his previous job.  He takes Aleve as needed.   Review of Systems   Objective: Vital Signs: There were no vitals taken for this visit.  Physical Exam  Ortho Exam Left ankle exam shows a fully healed surgical scars.  There is no significant swelling that I can appreciate.   His range of motion of his ankle is mildly limited.  He is ambulating without any limp.  Specialty Comments:  No specialty comments available.  Imaging: Xr Ankle Complete Left  Result Date: 04/18/2018 Stable fixation of ankle fracture with evidence of healing.    PMFS History: Patient Active Problem List   Diagnosis Date Noted  . Closed fracture of left lateral malleolus 11/21/2017  . Intractable vomiting with nausea 10/05/2016  . GERD (gastroesophageal reflux disease) 12/02/2015  . OSA on CPAP 12/02/2015  . DDD (degenerative disc disease), cervical 12/02/2015  . DDD (degenerative disc disease), lumbosacral 12/02/2015  . Essential hypertension 04/28/2014  . Erectile dysfunction 04/26/2014  . Obesity, unspecified 07/06/2013   Past Medical History:  Diagnosis Date  . Anxiety   . BPH with obstruction/lower urinary tract symptoms   . Closed fracture of left lateral malleolus   . GERD (gastroesophageal reflux disease)   . Hypertension   . Sleep apnea    does not wear CPAP  . Stress     Family History  Problem Relation Age of Onset  . Hypertension Mother   . Colon cancer Neg Hx   . Esophageal cancer Neg Hx   . Stomach cancer Neg Hx   . Rectal cancer Neg  Hx     Past Surgical History:  Procedure Laterality Date  . APPENDECTOMY     Age 77  . ORIF ANKLE FRACTURE Left 11/23/2017   Procedure: OPEN REDUCTION INTERNAL FIXATION (ORIF) LEFT LATERAL MALLEOLUS;  Surgeon: Leandrew Koyanagi, MD;  Location: [facility];  Service: Orthopedics;  Laterality: Left;   Social History   Occupational History  . 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 Smoking status: Never Smoker  . Smokeless tobacco: Never Used  . Tobacco comment: cigars- once yearly  Substance and Sexual Activity  . Alcohol use: Yes    Alcohol/week: 0.0 oz    Comment: Rare; holidays  . Drug use: No  . Sexual activity: Not on file

## 2018-04-18 NOTE — Telephone Encounter (Signed)
What orders for PT?

## 2018-04-18 NOTE — Telephone Encounter (Signed)
No more PT.  Just needs FCE

## 2018-04-18 NOTE — Telephone Encounter (Signed)
Talked to [facility] and advised patient needs an FCE only no more PT

## 2018-04-18 NOTE — Telephone Encounter (Signed)
Jenna/PT called wanted verbal orders for this patient. Can't schedule Patient until orders are clarified.  [phone]

## 2018-04-19 ENCOUNTER — Telehealth: Payer: Self-pay | Admitting: Family Medicine

## 2018-04-19 NOTE — Telephone Encounter (Signed)
Copied from [place] [id number]. Topic: Quick Communication - See Telephone Encounter >> Apr 19, 2018 11:26 AM Ivar Drape wrote: CRM for notification. See Telephone encounter for: 04/19/18. Patient stated his insurance company will no longer pay for tadalafil (CIALIS) 5 MG tablet name brand, but they will pay for the generic form.  Please send in refills for the generic form and send it to his preferred pharmacy [facility] [address].

## 2018-04-19 NOTE — Telephone Encounter (Signed)
  Patient states insurance company will no longer pay for tadalafil (CIALIS) 5 MG tablet name brand, but they will pay for the generic form.  Requesting refill of generic form.  [facility] [address]  LRF 01/02/18  #[phone] refills  LOV 03/13/18 M. Carlis Abbott

## 2018-04-21 NOTE — Telephone Encounter (Signed)
Please see note below. 

## 2018-05-01 ENCOUNTER — Telehealth (INDEPENDENT_AMBULATORY_CARE_PROVIDER_SITE_OTHER): Payer: Self-pay

## 2018-05-01 NOTE — Telephone Encounter (Signed)
Work comp needs updated work note for this patient

## 2018-05-01 NOTE — Telephone Encounter (Signed)
Please advise,

## 2018-05-02 ENCOUNTER — Encounter (INDEPENDENT_AMBULATORY_CARE_PROVIDER_SITE_OTHER): Payer: Self-pay

## 2018-05-02 NOTE — Telephone Encounter (Signed)
Emailed office note and work note her as well

## 2018-05-02 NOTE — Telephone Encounter (Signed)
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

## 2018-05-02 NOTE — Telephone Encounter (Signed)
Faxed work note. FYI

## 2018-05-25 ENCOUNTER — Encounter (INDEPENDENT_AMBULATORY_CARE_PROVIDER_SITE_OTHER): Payer: Self-pay | Admitting: Orthopaedic Surgery

## 2018-05-25 ENCOUNTER — Ambulatory Visit (INDEPENDENT_AMBULATORY_CARE_PROVIDER_SITE_OTHER): Payer: Worker's Compensation | Admitting: Orthopaedic Surgery

## 2018-05-25 DIAGNOSIS — S8262XD Displaced fracture of lateral malleolus of left fibula, subsequent encounter for closed fracture with routine healing: Secondary | ICD-10-CM | POA: Diagnosis not present

## 2018-05-25 NOTE — Progress Notes (Addendum)
   Office Visit Note   Patient: Michael Berger           Date of Birth: [birth year]           MRN: [id number] Visit Date: 05/25/2018              Requested by: Michael Knapp, MD [address] [place], [place] [address] PCP: Michael Knapp, MD   Assessment & Plan: Visit Diagnoses:  1. Closed displaced fracture of lateral malleolus of left fibula with routine healing, subsequent encounter     Plan: FCE results are valid and the recommend a medium physical demand category.  From a restriction standpoint these are detailed within the FCE findings.  From my standpoint he is at [place] and his permanent partial impairment is 20% of the affected foot and ankle.  Questions encouraged and answered.  Follow-up as needed.  Follow-Up Instructions: Return if symptoms worsen or fail to improve.   Orders:  No orders of the defined types were placed in this encounter.  No orders of the defined types were placed in this encounter.     Procedures: No procedures performed   Clinical Data: No additional findings.   Subjective: Chief Complaint  Patient presents with  . Follow-up    review FCE    Michael Berger returns today for review.   Review of Systems   Objective: Vital Signs: There were no vitals taken for this visit.  Physical Exam  Ortho Exam Ankle exam is stable.  Surgical scar is fully healed. Specialty Comments:  No specialty comments available.  Imaging: No results found.   PMFS History: Patient Active Problem List   Diagnosis Date Noted  . Closed fracture of left lateral malleolus 11/21/2017  . Intractable vomiting with nausea 10/05/2016  . GERD (gastroesophageal reflux disease) 12/02/2015  . OSA on CPAP 12/02/2015  . DDD (degenerative disc disease), cervical 12/02/2015  . DDD (degenerative disc disease), lumbosacral 12/02/2015  . Essential hypertension 04/28/2014  . Erectile dysfunction 04/26/2014  . Obesity, unspecified 07/06/2013   Past Medical History:    Diagnosis Date  . Anxiety   . BPH with obstruction/lower urinary tract symptoms   . Closed fracture of left lateral malleolus   . GERD (gastroesophageal reflux disease)   . Hypertension   . Sleep apnea    does not wear CPAP  . Stress     Family History  Problem Relation Age of Onset  . Hypertension Mother   . Colon cancer Neg Hx   . Esophageal cancer Neg Hx   . Stomach cancer Neg Hx   . Rectal cancer Neg Hx     Past Surgical History:  Procedure Laterality Date  . APPENDECTOMY     Age 57  . ORIF ANKLE FRACTURE Left 11/23/2017   Procedure: OPEN REDUCTION INTERNAL FIXATION (ORIF) LEFT LATERAL MALLEOLUS;  Surgeon: Leandrew Koyanagi, MD;  Location: [facility];  Service: Orthopedics;  Laterality: Left;   Social History   Occupational History  . 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 Smoking status: Never Smoker  . Smokeless tobacco: Never Used  . Tobacco comment: cigars- once yearly  Substance and Sexual Activity  . Alcohol use: Yes    Alcohol/week: 0.0 standard drinks    Comment: Rare; holidays  . Drug use: No  . Sexual activity: Not on file

## 2018-06-05 ENCOUNTER — Telehealth (INDEPENDENT_AMBULATORY_CARE_PROVIDER_SITE_OTHER): Payer: Self-pay

## 2018-06-05 NOTE — Telephone Encounter (Signed)
Emailed the 05/25/18 office note to the adj per her request

## 2018-10-01 ENCOUNTER — Encounter: Payer: Self-pay | Admitting: Gastroenterology

## 2018-10-24 ENCOUNTER — Telehealth: Payer: Self-pay | Admitting: Family Medicine

## 2018-10-24 NOTE — Telephone Encounter (Signed)
Patient was denied his medication Tadalafil 5 mg tabs. In order for it to be covered he would have to have benign prostatic hyperplasia.

## 2018-10-24 NOTE — Telephone Encounter (Signed)
Noted. He needs to be seen for any further medication adjustment. Thank you

## 2018-11-09 ENCOUNTER — Telehealth: Payer: Self-pay | Admitting: Family Medicine

## 2018-11-09 NOTE — Telephone Encounter (Signed)
LVM for pt to call the office and schedule an appt before his Tadalafil RX can be filled. - or find a different med. He is a Michael Berger pt so if we need to get him on Dr. Ardyth Gal schedule, please call [facility] and ask for Carolynne Edouard or Velna Hatchet for a sameday.

## 2018-12-11 ENCOUNTER — Other Ambulatory Visit: Payer: Self-pay

## 2018-12-11 ENCOUNTER — Encounter: Payer: Self-pay | Admitting: Family Medicine

## 2018-12-11 ENCOUNTER — Ambulatory Visit: Payer: BC Managed Care – PPO | Admitting: Family Medicine

## 2018-12-11 VITALS — BP 122/74 | HR 87 | Temp 99.1°F | Resp 20 | Ht 72.28 in | Wt 222.0 lb

## 2018-12-11 DIAGNOSIS — M5030 Other cervical disc degeneration, unspecified cervical region: Secondary | ICD-10-CM

## 2018-12-11 DIAGNOSIS — R05 Cough: Secondary | ICD-10-CM

## 2018-12-11 DIAGNOSIS — I10 Essential (primary) hypertension: Secondary | ICD-10-CM | POA: Diagnosis not present

## 2018-12-11 DIAGNOSIS — J069 Acute upper respiratory infection, unspecified: Secondary | ICD-10-CM | POA: Diagnosis not present

## 2018-12-11 DIAGNOSIS — R059 Cough, unspecified: Secondary | ICD-10-CM

## 2018-12-11 LAB — POCT INFLUENZA A/B
Influenza A, POC: NEGATIVE
Influenza B, POC: NEGATIVE

## 2018-12-11 MED ORDER — BENZONATATE 100 MG PO CAPS: 100.0000 mg | ORAL_CAPSULE | Freq: Two times a day (BID) | ORAL | 0 refills | Status: DC | PRN

## 2018-12-11 MED ORDER — LOSARTAN POTASSIUM-HCTZ 100-25 MG PO TABS: 1.0000 | ORAL_TABLET | Freq: Every day | ORAL | 1 refills | Status: DC

## 2018-12-11 MED ORDER — HYDROCODONE-HOMATROPINE 5-1.5 MG/5ML PO SYRP: 2.5000 mL | ORAL_SOLUTION | Freq: Every evening | ORAL | 0 refills | Status: DC | PRN

## 2018-12-11 NOTE — Patient Instructions (Addendum)
     [facility] Address: [address], [place], [place] [address] Phone: [phone]  If you have lab work done today you will be contacted with your lab results within the next 2 weeks.  If you have not heard from us then please contact us. The fastest way to get your results is to register for My Chart.   IF you received an x-ray today, you will receive an invoice from [facility]. Please contact [facility] at [phone] with questions or concerns regarding your invoice.   IF you received labwork today, you will receive an invoice from [facility]. Please contact [facility] at [phone] with questions or concerns regarding your invoice.   Our billing staff will not be able to assist you with questions regarding bills from these companies.  You will be contacted with the lab results as soon as they are available. The fastest way to get your results is to activate your My Chart account. Instructions are located on the last page of this paperwork. If you have not heard from us regarding the results in 2 weeks, please contact this office.      

## 2018-12-11 NOTE — Progress Notes (Signed)
3/2/20204:27 PM  Michael Berger 1960-11-14, 58 y.o. male [id number]  Chief Complaint  Patient presents with  . Cough    X 2 days- stuffy nose,scratchy throat, coughing up phelm  . Headache  . Medication Refill    Protonix    HPI:   Patient is a 58 y.o. male with past medical history significant for GERD, HTN and ED who presents today for  Cough, scratchy throat, headache, nasal congestion for past 2 days  Not been able to sleep Has been sneezing occ No itchy eyes nor ears Works with the public Does not smoke No SOB No h/o asthma or chronic lung disease Has diagnosis of OSA but stopped using cpap due to recurrent sinus infections Has not had flu vaccine this season Has not had fever but having night sweats  Requesting referral to ortho/spine - has seen Weston Anna Has known cervical DDD Having more issues with numbness of left arm, specially when he drives or when he sleeps. Moving his neck a certain way release pressure and provides instant relief  Wondering if h pylori infection back as having issues with gerd again  Fall Risk  12/11/2018 07/06/2017 10/18/2016 10/05/2016 12/02/2015  Falls in the past year? 0 No No No No  Number falls in past yr: 1 - - - -  Injury with Fall? 1 - - - -  Comment head02/2019  left ankle 2019 - - - -  Follow up Falls evaluation completed - - - -     Depression screen [facility] 2/9 12/11/2018 07/06/2017 10/18/2016  Decreased Interest 0 0 0  Down, Depressed, Hopeless 0 0 0  PHQ - 2 Score 0 0 0    Allergies  Allergen Reactions  . Other Rash    apples    Prior to Admission medications   Medication Sig Start Date End Date Taking? Authorizing Provider  hydrochlorothiazide (HYDRODIURIL) 25 MG tablet  10/22/18  Yes [provider]  losartan (COZAAR) 100 MG tablet  10/22/18  Yes [provider]  tadalafil (CIALIS) 5 MG tablet Take 1 tablet (5 mg total) by mouth daily. 01/02/18  Yes Shawnee Knapp, MD  benzonatate (TESSALON) 100 MG  capsule Take 1 capsule (100 mg total) by mouth 2 (two) times daily as needed for cough. Patient not taking: Reported on 12/11/2018 03/13/18   Tereasa Coop, PA-C  HYDROcodone-homatropine [facility]) 5-1.5 MG/5ML syrup Take 2.5-5 mLs by mouth at bedtime as needed. Patient not taking: Reported on 12/11/2018 03/13/18   Tereasa Coop, PA-C  losartan-hydrochlorothiazide (HYZAAR) 100-25 MG tablet Take 1 tablet by mouth daily. 12/16/17 03/13/18  Shawnee Knapp, MD  naproxen (NAPROSYN) 500 MG tablet Take 1 tablet (500 mg total) by mouth 2 (two) times daily with a meal. Patient not taking: Reported on 12/11/2018 03/13/18   Tereasa Coop, PA-C  pantoprazole (PROTONIX) 40 MG tablet Take 1 tablet (40 mg total) by mouth 2 (two) times daily. Patient not taking: Reported on 12/11/2018 11/23/17   Shawnee Knapp, MD    Past Medical History:  Diagnosis Date  . Anxiety   . BPH with obstruction/lower urinary tract symptoms   . Closed fracture of left lateral malleolus   . GERD (gastroesophageal reflux disease)   . Hypertension   . Sleep apnea    does not wear CPAP  . Stress     Past Surgical History:  Procedure Laterality Date  . APPENDECTOMY     Age 36  . ORIF ANKLE FRACTURE Left 11/23/2017  Procedure: OPEN REDUCTION INTERNAL FIXATION (ORIF) LEFT LATERAL MALLEOLUS;  Surgeon: Leandrew Koyanagi, MD;  Location: [facility];  Service: Orthopedics;  Laterality: Left;    Social History   Tobacco Use  . Smoking status: Never Smoker  . Smokeless tobacco: Never Used  . Tobacco comment: cigars- once yearly  Substance Use Topics  . Alcohol use: Yes    Alcohol/week: 0.0 standard drinks    Comment: Rare; holidays    Family History  Problem Relation Age of Onset  . Hypertension Mother   . Colon cancer Neg Hx   . Esophageal cancer Neg Hx   . Stomach cancer Neg Hx   . Rectal cancer Neg Hx     ROS Per hpi  OBJECTIVE:  Blood pressure 122/74, pulse 87, temperature 99.1 F (37.3 C), temperature source  Oral, resp. rate 20, height 6' 0.28" (1.836 m), weight 222 lb (100.7 kg), SpO2 98 %. Body mass index is 29.87 kg/m.   Physical Exam Vitals signs and nursing note reviewed.  Constitutional:      Appearance: He is well-developed.  HENT:     Head: Normocephalic and atraumatic.     Right Ear: Hearing, tympanic membrane, ear canal and external ear normal.     Left Ear: Hearing, tympanic membrane, ear canal and external ear normal.     Mouth/Throat:     Pharynx: No oropharyngeal exudate.  Eyes:     Conjunctiva/sclera: Conjunctivae normal.     Pupils: Pupils are equal, round, and reactive to light.  Neck:     Musculoskeletal: Neck supple.  Cardiovascular:     Rate and Rhythm: Normal rate and regular rhythm.     Heart sounds: No murmur. No friction rub. No gallop.   Pulmonary:     Effort: Pulmonary effort is normal.     Breath sounds: Normal breath sounds. No wheezing or rales.  Lymphadenopathy:     Cervical: No cervical adenopathy.  Skin:    General: Skin is warm and dry.  Neurological:     Mental Status: He is alert and oriented to person, place, and time.     Results for orders placed or performed in visit on 12/11/18 (from the past 24 hour(s))  POCT Influenza A/B     Status: None   Collection Time: 12/11/18  4:24 PM  Result Value Ref Range   Influenza A, POC Negative Negative   Influenza B, POC Negative Negative     ASSESSMENT and PLAN  1. URI with cough and congestion Discussed supportive measures for URI: increase hydration, rest, OTC medications, etc. RTC precautions discussed. - benzonatate (TESSALON) 100 MG capsule; Take 1 capsule (100 mg total) by mouth 2 (two) times daily as needed for cough. - HYDROcodone-homatropine (HYCODAN) 5-1.5 MG/5ML syrup; Take 2.5-5 mLs by mouth at bedtime as needed.  2. Cough - POCT Influenza A/B - neg  3. DDD (degenerative disc disease), cervical Worsening, previously evaluated by spine surg and surgical interventions recommended if  not better with PT. Referring back for further eval and treatment.  - Ambulatory referral to Spine Surgery  4. Essential hypertension Controlled. Continue current regime.  - losartan-hydrochlorothiazide (HYZAAR) 100-25 MG tablet; Take 1 tablet by mouth daily for 30 days.   Return for saturday preferred, any provider, for GERD.    Rutherford Guys, MD Primary Care at [facility], [place] [address] Ph.  [phone] Fax [phone]

## 2018-12-12 ENCOUNTER — Telehealth: Payer: Self-pay | Admitting: Family Medicine

## 2018-12-12 NOTE — Telephone Encounter (Signed)
Left VM in regards to his insurance info not being available at his pharmacy. The pharmacy states that his plan is out of the network

## 2018-12-16 ENCOUNTER — Encounter: Payer: Self-pay | Admitting: Family Medicine

## 2018-12-16 ENCOUNTER — Ambulatory Visit: Payer: BC Managed Care – PPO | Admitting: Family Medicine

## 2018-12-16 ENCOUNTER — Other Ambulatory Visit: Payer: Self-pay

## 2018-12-16 VITALS — BP 126/80 | HR 84 | Temp 98.6°F | Ht 72.0 in | Wt 225.8 lb

## 2018-12-16 DIAGNOSIS — R05 Cough: Secondary | ICD-10-CM | POA: Diagnosis not present

## 2018-12-16 DIAGNOSIS — Z09 Encounter for follow-up examination after completed treatment for conditions other than malignant neoplasm: Secondary | ICD-10-CM

## 2018-12-16 DIAGNOSIS — Z8619 Personal history of other infectious and parasitic diseases: Secondary | ICD-10-CM | POA: Insufficient documentation

## 2018-12-16 DIAGNOSIS — I10 Essential (primary) hypertension: Secondary | ICD-10-CM

## 2018-12-16 DIAGNOSIS — K219 Gastro-esophageal reflux disease without esophagitis: Secondary | ICD-10-CM | POA: Diagnosis not present

## 2018-12-16 DIAGNOSIS — M5030 Other cervical disc degeneration, unspecified cervical region: Secondary | ICD-10-CM

## 2018-12-16 DIAGNOSIS — R059 Cough, unspecified: Secondary | ICD-10-CM | POA: Insufficient documentation

## 2018-12-16 NOTE — Progress Notes (Signed)
Established Patient Office Visit  Subjective:  Patient ID: Michael Berger, male    DOB: [birth year]  Age: 58 y.o. MRN: [id number]  CC:  Chief Complaint  Patient presents with  . Gastroesophageal Reflux    request test for H-pylori  he has had a hx of this.     HPI Michael Berger is a 58 year old male who presents for follow up today.   Past Medical History:  Diagnosis Date  . Anxiety   . BPH with obstruction/lower urinary tract symptoms   . Closed fracture of left lateral malleolus   . GERD (gastroesophageal reflux disease)   . Hypertension   . Sleep apnea    does not wear CPAP  . Stress    Current Status: Since his last office visit, he has been having symptoms of GERD symptoms for a week now. He is currently not using any medication for relief of heartburn symptoms. No reports of GI problems such as nausea, vomiting, diarrhea, and constipation. He has no reports of blood in stools, dysuria and hematuria. He continues to have neck and extremity and neck numbness pain. His anxiety is mild today. He denies suicidal ideations, homicidal ideations, or auditory hallucinations.  He denies fevers, chills, fatigue, recent infections, weight loss, and night sweats. He has not had any headaches, visual changes, dizziness, and falls. No chest pain, heart palpitations, cough and shortness of breath reported.   Past Surgical History:  Procedure Laterality Date  . APPENDECTOMY     Age 47  . ORIF ANKLE FRACTURE Left 11/23/2017   Procedure: OPEN REDUCTION INTERNAL FIXATION (ORIF) LEFT LATERAL MALLEOLUS;  Surgeon: Leandrew Koyanagi, MD;  Location: [facility];  Service: Orthopedics;  Laterality: Left;    Family History  Problem Relation Age of Onset  . Hypertension Mother   . Colon cancer Neg Hx   . Esophageal cancer Neg Hx   . Stomach cancer Neg Hx   . Rectal cancer Neg Hx     Social History   Socioeconomic History  . Marital status: Married    Spouse name: Not on file    . Number of children: 1  . Years of education: Not on file  . Highest education level: Not on file  Occupational History  . 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 Financial resource strain: Not on file  . Food insecurity:    Worry: Not on file    Inability: Not on file  . Transportation needs:    Medical: Not on file    Non-medical: Not on file  Tobacco Use  . Smoking status: Never Smoker  . Smokeless tobacco: Never Used  . Tobacco comment: cigars- once yearly  Substance and Sexual Activity  . Alcohol use: Yes    Alcohol/week: 0.0 standard drinks    Comment: Rare; holidays  . Drug use: No  . Sexual activity: Yes  Lifestyle  . Physical activity:    Days per week: Not on file    Minutes per session: Not on file  . Stress: Not on file  Relationships  . Social connections:    Talks on phone: Not on file    Gets together: Not on file    Attends religious service: Not on file    Active member of club or organization: Not on file    Attends meetings of clubs or organizations: Not on file    Relationship status:  Not on file  . Intimate partner violence:    Fear of current or ex partner: Not on file    Emotionally abused: Not on file    Physically abused: Not on file    Forced sexual activity: Not on file  Other Topics Concern  . Not on file  Social History Narrative   Exercises 3 x's weekly.   Drinks coffee about 8 times a month    Outpatient Medications Prior to Visit  Medication Sig Dispense Refill  . benzonatate (TESSALON) 100 MG capsule Take 1 capsule (100 mg total) by mouth 2 (two) times daily as needed for cough. 20 capsule 0  . HYDROcodone-homatropine (HYCODAN) 5-1.5 MG/5ML syrup Take 2.5-5 mLs by mouth at bedtime as needed. 60 mL 0  . losartan-hydrochlorothiazide (HYZAAR) 100-25 MG tablet Take 1 tablet by mouth daily for 30 days. 90 tablet 1  . tadalafil (CIALIS) 5 MG tablet Take 1 tablet (5 mg  total) by mouth daily. 30 tablet 5  . pantoprazole (PROTONIX) 40 MG tablet Take 1 tablet (40 mg total) by mouth 2 (two) times daily. (Patient not taking: Reported on 12/16/2018) 60 tablet 0   No facility-administered medications prior to visit.     Allergies  Allergen Reactions  . Other Rash    apples    ROS Review of Systems  Constitutional: Negative.   HENT: Negative.   Eyes: Negative.   Respiratory: Negative.   Cardiovascular: Negative.   Gastrointestinal: Negative.   Endocrine: Negative.   Genitourinary: Negative.   Musculoskeletal: Positive for back pain (Chronic ).  Skin: Negative.   Allergic/Immunologic: Negative.   Neurological: Positive for dizziness and headaches.  Hematological: Negative.   Psychiatric/Behavioral: Negative.    Objective:   Physical Exam  Constitutional: He is oriented to person, place, and time. He appears well-developed and well-nourished.  HENT:  Head: Normocephalic and atraumatic.  Eyes: Conjunctivae are normal.  Neck: Normal range of motion. Neck supple.  Cardiovascular: Normal rate, regular rhythm, normal heart sounds and intact distal pulses.  Pulmonary/Chest: Effort normal and breath sounds normal.  Abdominal: Soft. Bowel sounds are normal.  Musculoskeletal: Normal range of motion.     Comments: Limited ROM in back  Neurological: He is alert and oriented to person, place, and time. He has normal reflexes.  Skin: Skin is warm and dry.  Psychiatric: He has a normal mood and affect. His behavior is normal. Judgment and thought content normal.   BP (!) 156/92 (BP Location: Right Arm, Patient Position: Sitting, Cuff Size: Large)   Pulse 84   Temp 98.6 F (37 C) (Oral)   Ht 6' (1.829 m)   Wt 225 lb 12.8 oz (102.4 kg)   SpO2 97%   BMI 30.62 kg/m  Wt Readings from Last 3 Encounters:  12/16/18 225 lb 12.8 oz (102.4 kg)  12/11/18 222 lb (100.7 kg)  03/13/18 233 lb 12.8 oz (106.1 kg)    Health Maintenance Due  Topic Date Due  .  COLONOSCOPY  08/25/2018    There are no preventive care reminders to display for this patient.  Lab Results  Component Value Date   TSH 1.86 12/02/2015   Lab Results  Component Value Date   WBC 7.0 07/06/2017   HGB 19.4 (H) 11/23/2017   HCT 57.0 (H) 11/23/2017   MCV 84.8 07/06/2017   PLT 223 10/18/2016   Lab Results  Component Value Date   NA 137 11/23/2017   K 5.5 (H) 11/23/2017   CO2 22 07/06/2017  GLUCOSE 103 (H) 11/23/2017   BUN 32 (H) 11/23/2017   CREATININE 1.20 11/23/2017   BILITOT 0.4 07/06/2017   ALKPHOS 61 07/06/2017   AST 26 07/06/2017   ALT 33 07/06/2017   PROT 8.6 (H) 07/06/2017   ALBUMIN 5.0 07/06/2017   CALCIUM 10.1 07/06/2017   Lab Results  Component Value Date   CHOL 193 07/06/2013   Lab Results  Component Value Date   HDL 40 07/06/2013   Lab Results  Component Value Date   LDLCALC 125 (H) 07/06/2013   Lab Results  Component Value Date   TRIG 142 07/06/2013   Lab Results  Component Value Date   CHOLHDL 4.8 07/06/2013   No results found for: HGBA1C  Assessment & Plan:   1. Gastroesophageal reflux disease without esophagitis - H. pylori breath test  2. History of Helicobacter pylori infection Results are pending. - H. pylori breath test  3. Essential hypertension Blood pressure is stable today. Blood pressure is 126/80 today. He will continue Hyzaar as prescribed.   4. Cough Improved. He will continue Benzonatate as needed.   5. DDD (degenerative disc disease), cervical Keep follow up with Percell Miller and Para March.   6. Follow up He will follow up as needed.   No orders of the defined types were placed in this encounter.   Orders Placed This Encounter  Procedures  . H. pylori breath test    Referral Orders  No referral(s) requested today    Kathe Becton,  MSN, FNP-C Primary Care at [place], [place] [address] [phone]   Body mass index is 30.62 kg/m. Problem List  Items Addressed This Visit      Cardiovascular and Mediastinum   Essential hypertension     Digestive   GERD (gastroesophageal reflux disease) - Primary   Relevant Orders   H. pylori breath test     Musculoskeletal and Integument   DDD (degenerative disc disease), cervical    Other Visit Diagnoses    History of Helicobacter pylori infection       Cough       Follow up          No orders of the defined types were placed in this encounter.   Follow-up: No follow-ups on file.    Azzie Glatter, FNP

## 2018-12-16 NOTE — Patient Instructions (Addendum)
° ° ° °  If you have lab work done today you will be contacted with your lab results within the next 2 weeks.  If you have not heard from us then please contact us. The fastest way to get your results is to register for My Chart. ° ° °IF you received an x-ray today, you will receive an invoice from [facility]. Please contact [facility] at [phone] with questions or concerns regarding your invoice.  ° °IF you received labwork today, you will receive an invoice from [facility]. Please contact [facility] at [phone] with questions or concerns regarding your invoice.  ° °Our billing staff will not be able to assist you with questions regarding bills from these companies. ° °You will be contacted with the lab results as soon as they are available. The fastest way to get your results is to activate your My Chart account. Instructions are located on the last page of this paperwork. If you have not heard from us regarding the results in 2 weeks, please contact this office. °  ° ° ° °

## 2018-12-19 LAB — H. PYLORI BREATH TEST: H pylori Breath Test: NEGATIVE

## 2018-12-19 LAB — H. PYLORI BREATH COLLECTION

## 2018-12-22 ENCOUNTER — Ambulatory Visit: Payer: Self-pay | Admitting: *Deleted

## 2018-12-22 NOTE — Telephone Encounter (Signed)
  Patient was seen last in office for sinusitis. Patient has developed dry cough this week- he has been using over the counter treatments- but the cough is lingering and causing him to have chest tightness. Reason for Disposition . [1] Continuous (nonstop) coughing interferes with work or school AND [2] no improvement using cough treatment per protocol  Answer Assessment - Initial Assessment Questions 1. ONSET: "When did the cough begin?"      Came back- 2 days 2. SEVERITY: "How bad is the cough today?"      Cough is bad enough- taking breath away 3. RESPIRATORY DISTRESS: "Describe your breathing."      Effecting ability to get good breath- fatigue 4. FEVER: "Do you have a fever?" If so, ask: "What is your temperature, how was it measured, and when did it start?"     No fever 5. HEMOPTYSIS: "Are you coughing up any blood?" If so ask: "How much?" (flecks, streaks, tablespoons, etc.)     no 6. TREATMENT: "What have you done so far to treat the cough?" (e.g., meds, fluids, humidifier)     hydrocodone cough syrup at night- is out now- dayquel   7. CARDIAC HISTORY: "Do you have any history of heart disease?" (e.g., heart attack, congestive heart failure)      no 8. LUNG HISTORY: "Do you have any history of lung disease?"  (e.g., pulmonary embolus, asthma, emphysema)     no 9. PE RISK FACTORS: "Do you have a history of blood clots?" (or: recent major surgery, recent prolonged travel, bedridden)     no 10. OTHER SYMPTOMS: "Do you have any other symptoms? (e.g., runny nose, wheezing, chest pain)       Chest tightness from cough 11. PREGNANCY: "Is there any chance you are pregnant?" "When was your last menstrual period?"       n/a 12. TRAVEL: "Have you traveled out of the country in the last month?" (e.g., travel history, exposures)       No travel  Protocols used: [place]

## 2018-12-23 ENCOUNTER — Other Ambulatory Visit: Payer: Self-pay

## 2018-12-23 ENCOUNTER — Encounter: Payer: Self-pay | Admitting: Osteopathic Medicine

## 2018-12-23 ENCOUNTER — Ambulatory Visit: Payer: BC Managed Care – PPO | Admitting: Osteopathic Medicine

## 2018-12-23 VITALS — BP 116/76 | HR 80 | Temp 98.6°F | Resp 16 | Ht 72.0 in | Wt 225.2 lb

## 2018-12-23 DIAGNOSIS — K219 Gastro-esophageal reflux disease without esophagitis: Secondary | ICD-10-CM

## 2018-12-23 DIAGNOSIS — J069 Acute upper respiratory infection, unspecified: Secondary | ICD-10-CM

## 2018-12-23 MED ORDER — BENZONATATE 100 MG PO CAPS: 100.0000 mg | ORAL_CAPSULE | Freq: Two times a day (BID) | ORAL | 0 refills | Status: DC | PRN

## 2018-12-23 MED ORDER — HYDROCODONE-HOMATROPINE 5-1.5 MG/5ML PO SYRP: 2.5000 mL | ORAL_SOLUTION | Freq: Every evening | ORAL | 0 refills | Status: DC | PRN

## 2018-12-23 MED ORDER — PANTOPRAZOLE SODIUM 40 MG PO TBEC: 40.0000 mg | DELAYED_RELEASE_TABLET | Freq: Two times a day (BID) | ORAL | 0 refills | Status: DC

## 2018-12-23 MED ORDER — IPRATROPIUM BROMIDE 0.06 % NA SOLN: 2.0000 | Freq: Four times a day (QID) | NASAL | 12 refills | Status: DC

## 2018-12-23 NOTE — Patient Instructions (Addendum)
Medications & Home Remedies for Upper Respiratory Illness   Note: the following list assumes no pregnancy, normal liver & kidney function and no other drug interactions. Dr. Sheppard Coil has highlighted medications which are safe for you to use, but these may not be appropriate for everyone. Always ask a pharmacist or qualified medical provider if you have any questions!    Aches/Pains, Fever, Headache OTC Acetaminophen (Tylenol) 500 mg tablets - take max 2 tablets (1000 mg) every 6 hours (4 times per day)  OTC Ibuprofen (Motrin) 200 mg tablets - take max 4 tablets (800 mg) every 6 hours*   Sinus Congestion Prescription Atrovent as directed OTC Nasal Saline if desired to rinse OTC Phenylephrine (Sudafed) 10 mg tablets every 4 hours (or the 12-hour formulation)* OTC Diphenhydramine (Benadryl) 25 mg tablets - take max 2 tablets every 4 hours   Cough & Sore Throat Prescription cough pills or syrups as directed OTC Dextromethorphan (Robitussin, others) - cough suppressant OTC Guaifenesin (Robitussin, Mucinex, others) - expectorant (helps cough up mucus) (Dextromethorphan and Guaifenesin also come in a combination tablet/syrup) OTC Lozenges w/ Benzocaine + Menthol (Cepacol) Honey - as much as you want! Teas which "coat the throat" - look for ingredients Elm Bark, Licorice Root, Marshmallow Root   Other OTC Zinc Lozenges within 24 hours of symptoms onset - mixed evidence this shortens the duration of the common cold Don't waste your money on Vitamin C or Echinacea in acute illness - it's already too late!    *Caution in patients with high blood pressure       If you have lab work done today you will be contacted with your lab results within the next 2 weeks.  If you have not heard from [place] then please contact us. The fastest way to get your results is to register for My Chart.   IF you received an x-ray today, you will receive an invoice from [facility]. Please contact [facility]  Radiology at [phone] with questions or concerns regarding your invoice.   IF you received labwork today, you will receive an invoice from [facility]. Please contact [facility] at [phone] with questions or concerns regarding your invoice.   Our billing staff will not be able to assist you with questions regarding bills from these companies.  You will be contacted with the lab results as soon as they are available. The fastest way to get your results is to activate your My Chart account. Instructions are located on the last page of this paperwork. If you have not heard from [place] regarding the results in 2 weeks, please contact this office.

## 2018-12-23 NOTE — Progress Notes (Signed)
HPI: Michael Berger is a 58 y.o. male who  has a past medical history of Anxiety, BPH with obstruction/lower urinary tract symptoms, Closed fracture of left lateral malleolus, GERD (gastroesophageal reflux disease), Hypertension, Sleep apnea, and Stress.  he presents to [facility] today, 12/23/18,  for chief complaint of: Chief Complaint  Patient presents with  . Cough    cough , congetsion, and tightness in chest for 3 days with no travels outside [place] an no contact with anyone of known sickness     . Context: Seen early last week (12 days ago) for similar: cough scratchy throat headache nasal congestion 2 days, this was treated with Tessalon, Hycodan.  He returns 5 days after that for complaints of GERD requesting H. pylori testing, that point he denied having cough.  Concerns today that cough from last week got better but this is a new issue for the past 3 days. . Location/Quality: Sinus congestion and runny nose, chest congestion, dry cough . Duration: 3 days at this point . Modifying factors: cough medicine was helping sleep.  He is out of [employer], requests refill on cough medicines. (PDMP reviewed, no red flag) . Assoc signs/symptoms: No fever     Past medical, surgical, social and family history reviewed:  Patient Active Problem List   Diagnosis Date Noted  . History of Helicobacter pylori infection 12/16/2018  . Cough 12/16/2018  . Closed fracture of left lateral malleolus 11/21/2017  . Intractable vomiting with nausea 10/05/2016  . GERD (gastroesophageal reflux disease) 12/02/2015  . OSA on CPAP 12/02/2015  . DDD (degenerative disc disease), cervical 12/02/2015  . DDD (degenerative disc disease), lumbosacral 12/02/2015  . Essential hypertension 04/28/2014  . Erectile dysfunction 04/26/2014  . Obesity, unspecified 07/06/2013    Past Surgical History:  Procedure Laterality Date  . APPENDECTOMY     Age 37  . ORIF ANKLE FRACTURE Left  11/23/2017   Procedure: OPEN REDUCTION INTERNAL FIXATION (ORIF) LEFT LATERAL MALLEOLUS;  Surgeon: Leandrew Koyanagi, MD;  Location: [facility];  Service: Orthopedics;  Laterality: Left;    Social History   Tobacco Use  . Smoking status: Never Smoker  . Smokeless tobacco: Never Used  . Tobacco comment: cigars- once yearly  Substance Use Topics  . Alcohol use: Yes    Alcohol/week: 0.0 standard drinks    Comment: Rare; holidays    Family History  Problem Relation Age of Onset  . Hypertension Mother   . Colon cancer Neg Hx   . Esophageal cancer Neg Hx   . Stomach cancer Neg Hx   . Rectal cancer Neg Hx      Current medication list and allergy/intolerance information reviewed:    Current Outpatient Medications  Medication Sig Dispense Refill  . losartan-hydrochlorothiazide (HYZAAR) 100-25 MG tablet Take 1 tablet by mouth daily for 30 days. 90 tablet 1  . pantoprazole (PROTONIX) 40 MG tablet Take 1 tablet (40 mg total) by mouth 2 (two) times daily. 60 tablet 0  . tadalafil (CIALIS) 5 MG tablet Take 1 tablet (5 mg total) by mouth daily. 30 tablet 5  . benzonatate (TESSALON) 100 MG capsule Take 1 capsule (100 mg total) by mouth 2 (two) times daily as needed for cough. 20 capsule 0  . HYDROcodone-homatropine (HYCODAN) 5-1.5 MG/5ML syrup Take 2.5-5 mLs by mouth at bedtime as needed. 60 mL 0  . ipratropium (ATROVENT) 0.06 % nasal spray Place 2 sprays into both nostrils 4 (four) times daily. 15 mL  12   No current facility-administered medications for this visit.     Allergies  Allergen Reactions  . Other Rash    apples      Review of Systems:  Constitutional:  No  fever, no chills, +recent illness, No unintentional weight changes. +significant fatigue.   HEENT: No  headache, no vision change, no hearing change, +sore throat, +sinus pressure  Cardiac: No  chest pain, No  pressure, No palpitations  Respiratory:  No  shortness of breath. +Cough  Gastrointestinal: No   abdominal pain, No  nausea, No  vomiting,  No  blood in stool, No  diarrhea  Musculoskeletal: No new myalgia/arthralgia  Skin: No  Rash  Neurologic: No  weakness, No  dizziness  Exam:  BP 116/76 (BP Location: Right Arm, Patient Position: Sitting, Cuff Size: Large)   Pulse 80   Temp 98.6 F (37 C) (Oral)   Resp 16   Ht 6' (1.829 m)   Wt 225 lb 3.2 oz (102.2 kg)   SpO2 96%   BMI 30.54 kg/m   Constitutional: VS see above. General Appearance: alert, well-developed, well-nourished, NAD  Eyes: Normal lids and conjunctive, non-icteric sclera  Ears, Nose, Mouth, Throat: MMM, Normal external inspection ears/nares/mouth/lips/gums. TM normal bilaterally. Pharynx/tonsils +erythema, no exudate. Nasal mucosa congested.   Neck: No masses, trachea midline. No tenderness/mass appreciated. No lymphadenopathy  Respiratory: Normal respiratory effort. no wheeze, no rhonchi, no rales  Cardiovascular: S1/S2 normal, no murmur, no rub/gallop auscultated. RRR. No lower extremity edema.  Musculoskeletal: Gait normal.   Neurological: Normal balance/coordination. No tremor.   Skin: warm, dry, intact.   Psychiatric: Normal judgment/insight. Normal mood and affect.          ASSESSMENT/PLAN:   URI with cough and congestion - Plan: benzonatate (TESSALON) 100 MG capsule, HYDROcodone-homatropine (HYCODAN) 5-1.5 MG/5ML syrup  Gastroesophageal reflux disease without esophagitis    Patient Instructions   Medications & Home Remedies for Upper Respiratory Illness   Note: the following list assumes no pregnancy, normal liver & kidney function and no other drug interactions. Dr. Sheppard Coil has highlighted medications which are safe for you to use, but these may not be appropriate for everyone. Always ask a pharmacist or qualified medical provider if you have any questions!    Aches/Pains, Fever, Headache OTC Acetaminophen (Tylenol) 500 mg tablets - take max 2 tablets (1000 mg) every 6 hours (4  times per day)  OTC Ibuprofen (Motrin) 200 mg tablets - take max 4 tablets (800 mg) every 6 hours*   Sinus Congestion Prescription Atrovent as directed OTC Nasal Saline if desired to rinse OTC Phenylephrine (Sudafed) 10 mg tablets every 4 hours (or the 12-hour formulation)* OTC Diphenhydramine (Benadryl) 25 mg tablets - take max 2 tablets every 4 hours   Cough & Sore Throat Prescription cough pills or syrups as directed OTC Dextromethorphan (Robitussin, others) - cough suppressant OTC Guaifenesin (Robitussin, Mucinex, others) - expectorant (helps cough up mucus) (Dextromethorphan and Guaifenesin also come in a combination tablet/syrup) OTC Lozenges w/ Benzocaine + Menthol (Cepacol) Honey - as much as you want! Teas which "coat the throat" - look for ingredients Elm Bark, Licorice Root, Marshmallow Root   Other OTC Zinc Lozenges within 24 hours of symptoms onset - mixed evidence this shortens the duration of the common cold Don't waste your money on Vitamin C or Echinacea in acute illness - it's already too late!    *Caution in patients with high blood pressure       If you have  lab work done today you will be contacted with your lab results within the next 2 weeks.  If you have not heard from [place] then please contact us. The fastest way to get your results is to register for My Chart.   IF you received an x-ray today, you will receive an invoice from [facility]. Please contact [facility] Radiology at [phone] with questions or concerns regarding your invoice.   IF you received labwork today, you will receive an invoice from [place]. Please contact [facility] at [phone] with questions or concerns regarding your invoice.   Our billing staff will not be able to assist you with questions regarding bills from these companies.  You will be contacted with the lab results as soon as they are available. The fastest way to get your results is to activate your My Chart  account. Instructions are located on the last page of this paperwork. If you have not heard from [place] regarding the results in 2 weeks, please contact this office.         Visit summary with medication list and pertinent instructions was printed for patient to review. All questions at time of visit were answered - patient instructed to contact office with any additional concerns. ER/RTC precautions were reviewed with the patient.   Follow-up plan: Return if symptoms worsen or fail to improve.    Please note: voice recognition software was used to produce this document, and typos may escape review. Please contact Dr. Sheppard Coil for any needed clarifications.

## 2019-01-14 ENCOUNTER — Other Ambulatory Visit: Payer: Self-pay | Admitting: Osteopathic Medicine

## 2019-02-03 ENCOUNTER — Other Ambulatory Visit: Payer: Self-pay | Admitting: Family Medicine

## 2019-02-04 ENCOUNTER — Other Ambulatory Visit: Payer: Self-pay | Admitting: Osteopathic Medicine

## 2019-02-04 MED ORDER — PANTOPRAZOLE SODIUM 40 MG PO TBEC: 40.0000 mg | DELAYED_RELEASE_TABLET | Freq: Two times a day (BID) | ORAL | 1 refills | Status: AC

## 2019-02-04 NOTE — Addendum Note (Signed)
Addended by: Amado Coe on: 02/04/2019 11:53 AM   Modules accepted: Orders

## 2019-05-16 ENCOUNTER — Telehealth: Payer: Self-pay | Admitting: Family Medicine

## 2019-05-16 NOTE — Telephone Encounter (Signed)
Relation to pt: self  Call back number: [phone]  Pharmacy: [facility] ([address] [place] [phone] (Phone) [phone] (Fax)     Reason for call:  Patient requesting tadalafil (CIALIS) 5 MG tablet, patient informed please allow 48 to 72 hour turn around time.

## 2019-05-16 NOTE — Telephone Encounter (Signed)
Pt will needs appt for med, needs to est care

## 2019-07-12 ENCOUNTER — Encounter: Payer: Self-pay | Admitting: Gastroenterology

## 2019-07-18 ENCOUNTER — Ambulatory Visit (AMBULATORY_SURGERY_CENTER): Payer: Self-pay | Admitting: *Deleted

## 2019-07-18 ENCOUNTER — Other Ambulatory Visit: Payer: Self-pay

## 2019-07-18 VITALS — Temp 97.3°F | Ht 72.0 in | Wt 223.0 lb

## 2019-07-18 DIAGNOSIS — Z86010 Personal history of colonic polyps: Secondary | ICD-10-CM

## 2019-07-18 MED ORDER — PEG 3350-KCL-NA BICARB-NACL 420 G PO SOLR: 4000.0000 mL | Freq: Once | ORAL | 0 refills | Status: AC

## 2019-07-18 NOTE — Progress Notes (Signed)

## 2019-07-19 ENCOUNTER — Encounter: Payer: Self-pay | Admitting: Gastroenterology

## 2019-08-02 ENCOUNTER — Other Ambulatory Visit: Payer: Self-pay

## 2019-08-02 ENCOUNTER — Encounter: Payer: Self-pay | Admitting: Cardiology

## 2019-08-02 ENCOUNTER — Ambulatory Visit: Payer: BC Managed Care – PPO | Admitting: Cardiology

## 2019-08-02 VITALS — BP 145/88 | HR 80 | Temp 96.0°F | Ht 72.0 in | Wt 225.9 lb

## 2019-08-02 DIAGNOSIS — I209 Angina pectoris, unspecified: Secondary | ICD-10-CM | POA: Diagnosis not present

## 2019-08-02 DIAGNOSIS — I10 Essential (primary) hypertension: Secondary | ICD-10-CM | POA: Diagnosis not present

## 2019-08-02 DIAGNOSIS — R55 Syncope and collapse: Secondary | ICD-10-CM

## 2019-08-02 DIAGNOSIS — R9431 Abnormal electrocardiogram [ECG] [EKG]: Secondary | ICD-10-CM

## 2019-08-02 MED ORDER — METOPROLOL TARTRATE 25 MG PO TABS: 25.0000 mg | ORAL_TABLET | Freq: Two times a day (BID) | ORAL | 2 refills | Status: DC

## 2019-08-02 MED ORDER — ASPIRIN EC 81 MG PO TBEC: 81.0000 mg | DELAYED_RELEASE_TABLET | Freq: Every day | ORAL | 3 refills | Status: DC

## 2019-08-02 NOTE — Progress Notes (Signed)
Primary Physician:  Shawnee Knapp, MD   Patient ID: Michael Berger, male    DOB: 11/06/1960, 58 y.o.   MRN: [id number]  Subjective:    Chief Complaint  Patient presents with   Shortness of Breath   Chest Pain    HPI: Michael Berger  is a 58 y.o. male  with hypertension, last seen by [place] in 2014 found to have hypertensive heart disease without heart failure.  He was advised as needed follow-up.  He underwent nuclear stress testing at that time that was considered low risk study.  Patient has recently had 2 episodes of exertional dizziness, sweating, shortness of breath, and mild chest discomfort that improved with sitting and resting. He states he felt very hot during the episodes. Chest pain can feel like it is radiating to under his left arm.  He states that blood pressure has recently been elevated. He reports having labs performed recently with his PCP, he is unsure of the results. He states that he has questionable history of prediabetes.  He is retired Editor, commissioning, he is now working as a DOT Nurse, adult.He does regularly lift weights, but has not done this recently.   Past Medical History:  Diagnosis Date   Anxiety    past hx of situational anxiety    BPH with obstruction/lower urinary tract symptoms    Closed fracture of left lateral malleolus    GERD (gastroesophageal reflux disease)    Hypertension    Sleep apnea    does not wear CPAP   Stress     Past Surgical History:  Procedure Laterality Date   APPENDECTOMY     Age 84   COLONOSCOPY     ORIF ANKLE FRACTURE Left 11/23/2017   Procedure: OPEN REDUCTION INTERNAL FIXATION (ORIF) LEFT LATERAL MALLEOLUS;  Surgeon: Michael Koyanagi, MD;  Location: [facility];  Service: Orthopedics;  Laterality: Left;   POLYPECTOMY      Social History   Socioeconomic History   Marital status: Married    Spouse name: Not on file   Number of children: 1   Years of education: Not on file   Highest  education level: Not on file  Occupational History   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    Comment: [place] [employer] [place] resource strain: Not on file   Food insecurity    Worry: Not on file    Inability: Not on file   Transportation needs    Medical: Not on file    Non-medical: Not on file  Tobacco Use   Smoking status: Never Smoker   Smokeless tobacco: Never Used   Tobacco comment: cigars- once yearly  Substance and Sexual Activity   Alcohol use: Yes    Alcohol/week: 0.0 standard drinks    Comment: Rare; holidays   Drug use: No   Sexual activity: Yes  Lifestyle   Physical activity    Days per week: Not on file    Minutes per session: Not on file   Stress: Not on file  Relationships   Social connections    Talks on phone: Not on file    Gets together: Not on file    Attends religious service: Not on file    Active member of club or organization: Not on file    Attends meetings of clubs or organizations: Not on file    Relationship status: Not on file   Intimate partner violence  Fear of current or ex partner: Not on file    Emotionally abused: Not on file    Physically abused: Not on file    Forced sexual activity: Not on file  Other Topics Concern   Not on file  Social History Narrative   Exercises 3 x's weekly.   Drinks coffee about 8 times a month    Review of Systems  Constitution: Negative for decreased appetite, malaise/fatigue, weight gain and weight loss.  Eyes: Negative for visual disturbance.  Cardiovascular: Positive for chest pain and dyspnea on exertion. Negative for claudication, leg swelling, orthopnea, palpitations and syncope.  Respiratory: Negative for hemoptysis and wheezing.   Endocrine: Negative for cold intolerance and heat intolerance.  Hematologic/Lymphatic: Does not bruise/bleed easily.  Skin: Negative for nail changes.  Musculoskeletal: Negative for muscle weakness and myalgias.   Gastrointestinal: Negative for abdominal pain, change in bowel habit, nausea and vomiting.  Neurological: Positive for dizziness. Negative for difficulty with concentration, focal weakness and headaches.  Psychiatric/Behavioral: Negative for altered mental status and suicidal ideas.  All other systems reviewed and are negative.     Objective:  Blood pressure (!) 145/88, pulse 80, temperature (!) 96 F (35.6 C), height 6' (1.829 m), weight 225 lb 14.4 oz (102.5 kg), SpO2 97 %. Body mass index is 30.64 kg/m.    Physical Exam  Constitutional: He is oriented to person, place, and time. Vital signs are normal. He appears well-developed and well-nourished.  HENT:  Head: Normocephalic and atraumatic.  Neck: Normal range of motion.  Cardiovascular: Normal rate, regular rhythm, normal heart sounds and intact distal pulses.  Pulmonary/Chest: Effort normal and breath sounds normal. No accessory muscle usage. No respiratory distress.  Abdominal: Soft. Bowel sounds are normal.  Musculoskeletal: Normal range of motion.  Neurological: He is alert and oriented to person, place, and time.  Skin: Skin is warm and dry.  Vitals reviewed.  Radiology: No results found.  Laboratory examination:    CMP Latest Ref Rng & Units 11/23/2017 07/06/2017 10/18/2016  Glucose 65 - 99 mg/dL 103(H) 89 91  BUN 6 - 20 mg/dL 32(H) 18 11  Creatinine 0.61 - 1.24 mg/dL 1.20 1.34(H) 1.27  Sodium 135 - 145 mmol/L 137 140 143  Potassium 3.5 - 5.1 mmol/L 5.5(H) 4.0 4.3  Chloride 101 - 111 mmol/L 104 100 103  CO2 20 - 29 mmol/L - 22 19  Calcium 8.7 - 10.2 mg/dL - 10.1 9.9  Total Protein 6.0 - 8.5 g/dL - 8.6(H) 8.0  Total Bilirubin 0.0 - 1.2 mg/dL - 0.4 0.4  Alkaline Phos 39 - 117 IU/L - 61 71  AST 0 - 40 IU/L - 26 26  ALT 0 - 44 IU/L - 33 32   CBC Latest Ref Rng & Units 11/23/2017 07/06/2017 10/18/2016  WBC 4.6 - 10.2 K/uL - 7.0 9.6  Hemoglobin 13.0 - 17.0 g/dL 19.4(H) 18.5(A) 18.2(H)  Hematocrit 39.0 - 52.0 % 57.0(H)  55.4(A) 52.1(H)  Platelets 150 - 379 x10E3/uL - - 223   Lipid Panel     Component Value Date/Time   CHOL 193 07/06/2013 1701   TRIG 142 07/06/2013 1701   HDL 40 07/06/2013 1701   CHOLHDL 4.8 07/06/2013 1701   VLDL 28 07/06/2013 1701   LDLCALC 125 (H) 07/06/2013 1701   HEMOGLOBIN A1C No results found for: HGBA1C, MPG TSH No results for input(s): TSH in the last 8760 hours.  PRN Meds:. Medications Discontinued During This Encounter  Medication Reason   benzonatate (TESSALON) 100 MG  capsule Error   HYDROcodone-homatropine (HYCODAN) 5-1.5 MG/5ML syrup Error   ipratropium (ATROVENT) 0.06 % nasal spray Error   Multiple Vitamin (MULTIVITAMIN) tablet Error   Current Meds  Medication Sig   L-Arginine POWD Take by mouth. Pre workout drink   losartan-hydrochlorothiazide (HYZAAR) 100-25 MG tablet Take 1 tablet by mouth daily for 30 days.   pantoprazole (PROTONIX) 40 MG tablet Take 1 tablet (40 mg total) by mouth 2 (two) times daily.   tadalafil (CIALIS) 5 MG tablet Take 1 tablet by mouth once daily   [DISCONTINUED] HYDROcodone-homatropine (HYCODAN) 5-1.5 MG/5ML syrup Take 2.5-5 mLs by mouth at bedtime as needed.    Cardiac Studies:   Exercise Cardiolite stress test 08/20/2013: 1. Resting EKG showed normal sinus rhythm, poor R wave progression, Stress EKG was negative for ischemia.  There was brief exercise induced LBBB at peak exercise (3 beat). Patient exercised on BRUCE PROTOCOL for 10 minutes 23 seconds. The maximum work level achieved was 11.2 METs.  Mildly hypertensive BP response with stress. The baseline blood pressure was 128/90 mmHg and 160/100 mmHg with exercise. The test was terminated due to achievement of the target heart rate.  2. Perfusion imaging study demonstrated mild soft tissue attenuation consistent with diaphragmatic attenuation. There was no e/o ischemia or scar. Left ventricular ejection fraction was estimated to be 73%. This is a low risk study.  Echo-  08/02/13 1. Left ventricular cavity is normal in size.   Mild to moderate concentric hypertrophy.   Normal global wall motion.   Normal systolic global function.   Calculated EF 68%. 2. Tricuspid valve structurally normal.   Trace tricuspid regurgitation.  Assessment:   Angina pectoris ([place]) - Plan: EKG 12-Lead, PCV ECHOCARDIOGRAM COMPLETE, PCV MYOCARDIAL PERFUSION WO LEXISCAN  Essential hypertension  Abnormal EKG - Plan: PCV ECHOCARDIOGRAM COMPLETE, PCV MYOCARDIAL PERFUSION WO LEXISCAN  Near syncope  EKG 08/02/2019: Normal sinus rhythm at 80 bpm, normal axis, Q waves in inferior leads suggestive of possible old inferior infarct. No evidence of ischemia. Abnormal EKG  Recommendations:   Patient is here for follow-up for chest pain and shortness of breath.  He has recently had 2 episodes that are concerning for potential angina.  His symptoms could also be related to vasovagal.  He does have mildly abnormal EKG with Q waves present in the inferior leads.  I will start him on metoprolol tartrate 25 mg twice daily.  Blood pressure is also elevated, and hopefully will improve with this.  We will start aspirin 81 mg daily until he can undergo further cardiac evaluation.  We will schedule for exercise nuclear stress testing along with echocardiogram to exclude any structural abnormalities.  If cardiac work-up is unyielding, feel that his symptoms were likely related to vasovagal etiology.  I will request recent labs from PCP office for further restratification.  I have encouraged him to take it easy until he can follow-up with me after his test results.  We will see him back in approximately 4 to 6 weeks for follow-up.   *I have discussed this case with Dr. Einar Gip and he personally examined the patient and participated in formulating the plan.*   Miquel Dunn, MSN, APRN, FNP-C [facility] Cardiovascular. [place] Office: [phone] Fax: [phone]

## 2019-08-09 ENCOUNTER — Other Ambulatory Visit: Payer: BC Managed Care – PPO

## 2019-08-10 ENCOUNTER — Encounter: Payer: Self-pay | Admitting: Gastroenterology

## 2019-08-13 ENCOUNTER — Other Ambulatory Visit: Payer: Self-pay

## 2019-08-13 ENCOUNTER — Ambulatory Visit (INDEPENDENT_AMBULATORY_CARE_PROVIDER_SITE_OTHER): Payer: BC Managed Care – PPO

## 2019-08-13 DIAGNOSIS — R9431 Abnormal electrocardiogram [ECG] [EKG]: Secondary | ICD-10-CM

## 2019-08-13 DIAGNOSIS — I209 Angina pectoris, unspecified: Secondary | ICD-10-CM | POA: Diagnosis not present

## 2019-08-15 ENCOUNTER — Other Ambulatory Visit: Payer: BC Managed Care – PPO

## 2019-08-16 NOTE — Progress Notes (Signed)
Called pt to inform him about his echo results. Pt understood

## 2019-08-17 ENCOUNTER — Telehealth: Payer: Self-pay | Admitting: *Deleted

## 2019-08-17 NOTE — Telephone Encounter (Signed)
Scheduled Covid test 11-11 WED at 1250 pm with pt  As well - pt aware of date, time, location of testing

## 2019-08-17 NOTE — Telephone Encounter (Signed)
Golytely not covered-  Needs different prep -- Spoke to pt he has colon 11-13 no prep yet  will do a suprep sample and new instructions - pt will pick up Monday 11-9  Pt states since PV he had some chest pain, saw cardio, echo completed that is normal, EF normal, no AVS- he has a stress test scheduled for Monday 11-9 at 1215 pm- I instructed him we will have to F/U with this and if normal he will have to RS colon- he understands and I placed in Book in 50 for [place] to F/U on Monday --

## 2019-08-20 ENCOUNTER — Ambulatory Visit (INDEPENDENT_AMBULATORY_CARE_PROVIDER_SITE_OTHER): Payer: BC Managed Care – PPO

## 2019-08-20 ENCOUNTER — Other Ambulatory Visit: Payer: Self-pay

## 2019-08-20 DIAGNOSIS — I209 Angina pectoris, unspecified: Secondary | ICD-10-CM

## 2019-08-20 DIAGNOSIS — R9431 Abnormal electrocardiogram [ECG] [EKG]: Secondary | ICD-10-CM | POA: Diagnosis not present

## 2019-08-21 ENCOUNTER — Telehealth: Payer: Self-pay | Admitting: *Deleted

## 2019-08-21 NOTE — Telephone Encounter (Signed)
Michael Berger,  This pt has a colon Friday 11-13- he recently had some chest discomfort and had an Echo  11-2 and Stress test yesterday 11-9- both are normal-  Echo EF 55 % , no AVS and Stress test  EF 67 % and low risk study. He follows up with cardio in 4 weeks-  Is he okay to proceed with colon Friday 11-13.  Lelan Pons

## 2019-08-21 NOTE — Telephone Encounter (Signed)
Marie,  This pt is cleared for anesthetic care at [facility].  Thanks,  Anyelo Mccue 

## 2019-08-22 ENCOUNTER — Ambulatory Visit (INDEPENDENT_AMBULATORY_CARE_PROVIDER_SITE_OTHER): Payer: BC Managed Care – PPO

## 2019-08-22 ENCOUNTER — Other Ambulatory Visit: Payer: Self-pay | Admitting: Gastroenterology

## 2019-08-22 DIAGNOSIS — Z1159 Encounter for screening for other viral diseases: Secondary | ICD-10-CM

## 2019-08-23 LAB — SARS CORONAVIRUS 2 (TAT 6-24 HRS): SARS Coronavirus 2: NEGATIVE

## 2019-08-24 ENCOUNTER — Other Ambulatory Visit: Payer: Self-pay

## 2019-08-24 ENCOUNTER — Encounter: Payer: Self-pay | Admitting: Gastroenterology

## 2019-08-24 ENCOUNTER — Ambulatory Visit (AMBULATORY_SURGERY_CENTER): Payer: BC Managed Care – PPO | Admitting: Gastroenterology

## 2019-08-24 VITALS — BP 86/51 | HR 58 | Temp 98.1°F | Resp 15 | Ht 72.0 in | Wt 223.0 lb

## 2019-08-24 DIAGNOSIS — D123 Benign neoplasm of transverse colon: Secondary | ICD-10-CM

## 2019-08-24 DIAGNOSIS — Z86010 Personal history of colonic polyps: Secondary | ICD-10-CM

## 2019-08-24 MED ORDER — SODIUM CHLORIDE 0.9 % IV SOLN: 500.0000 mL | Freq: Once | INTRAVENOUS | Status: DC

## 2019-08-24 NOTE — Progress Notes (Signed)
A and O x3. Report to RN. Tolerated MAC anesthesia well.

## 2019-08-24 NOTE — Op Note (Signed)
[place] Patient Name: Michael Berger Procedure Date: 08/24/2019 1:13 PM MRN: [id number] Endoscopist: Milus Banister , MD Age: 58 Referring MD:  Date of Birth: 12-27-1960 Gender: Male Account #: [id number] Procedure:                Colonoscopy Indications:              High risk colon cancer surveillance: Personal                            history of colonic polyps; 2016 colonoscopy 3 subCM                            adenomas Medicines:                Monitored Anesthesia Care Procedure:                Pre-Anesthesia Assessment:                           - Prior to the procedure, a History and Physical                            was performed, and patient medications and                            allergies were reviewed. The patient's tolerance of                            previous anesthesia was also reviewed. The risks                            and benefits of the procedure and the sedation                            options and risks were discussed with the patient.                            All questions were answered, and informed consent                            was obtained. Prior Anticoagulants: The patient has                            taken no previous anticoagulant or antiplatelet                            agents. ASA Grade Assessment: II - A patient with                            mild systemic disease. After reviewing the risks                            and benefits, the patient was deemed in  satisfactory condition to undergo the procedure.                           After obtaining informed consent, the colonoscope                            was passed under direct vision. Throughout the                            procedure, the patient's blood pressure, pulse, and                            oxygen saturations were monitored continuously. The                            Colonoscope was introduced through the anus and                         advanced to the the cecum, identified by                            appendiceal orifice and ileocecal valve. The                            colonoscopy was performed without difficulty. The                            patient tolerated the procedure well. The quality                            of the bowel preparation was good. The ileocecal                            valve, appendiceal orifice, and rectum were                            photographed. Scope In: 1:23:49 PM Scope Out: 1:36:15 PM Scope Withdrawal Time: 0 hours 9 minutes 20 seconds  Total Procedure Duration: 0 hours 12 minutes 26 seconds  Findings:                 A 2 mm polyp was found in the transverse colon. The                            polyp was sessile. The polyp was removed with a                            cold snare. Resection and retrieval were complete.                           Multiple small and large-mouthed diverticula were                            found in the left colon.  The exam was otherwise without abnormality on                            direct and retroflexion views. Complications:            No immediate complications. Estimated blood loss:                            None. Estimated Blood Loss:     Estimated blood loss: none. Impression:               - One 2 mm polyp in the transverse colon, removed                            with a cold snare. Resected and retrieved.                           - Diverticulosis in the left colon.                           - The examination was otherwise normal on direct                            and retroflexion views. Recommendation:           - Patient has a contact number available for                            emergencies. The signs and symptoms of potential                            delayed complications were discussed with the                            patient. Return to normal activities tomorrow.                             Written discharge instructions were provided to the                            patient.                           - Resume previous diet.                           - Continue present medications.                           - Await pathology results. Milus Banister, MD 08/24/2019 1:38:38 PM This report has been signed electronically.

## 2019-08-24 NOTE — Progress Notes (Signed)
Temperature taken by J.B., VS taken by C.W. 

## 2019-08-24 NOTE — Patient Instructions (Signed)
Information on polyps and diverticulosis given to you today.  Await pathology results.  YOU HAD AN ENDOSCOPIC PROCEDURE TODAY AT THE [facility]:   Refer to the procedure report that was given to you for any specific questions about what was found during the examination.  If the procedure report does not answer your questions, please call your gastroenterologist to clarify.  If you requested that your care partner not be given the details of your procedure findings, then the procedure report has been included in a sealed envelope for you to review at your convenience later.  YOU SHOULD EXPECT: Some feelings of bloating in the abdomen. Passage of more gas than usual.  Walking can help get rid of the air that was put into your GI tract during the procedure and reduce the bloating. If you had a lower endoscopy (such as a colonoscopy or flexible sigmoidoscopy) you may notice spotting of blood in your stool or on the toilet paper. If you underwent a bowel prep for your procedure, you may not have a normal bowel movement for a few days.  Please Note:  You might notice some irritation and congestion in your nose or some drainage.  This is from the oxygen used during your procedure.  There is no need for concern and it should clear up in a day or so.  SYMPTOMS TO REPORT IMMEDIATELY:   Following lower endoscopy (colonoscopy or flexible sigmoidoscopy):  Excessive amounts of blood in the stool  Significant tenderness or worsening of abdominal pains  Swelling of the abdomen that is new, acute  Fever of 100F or higher   For urgent or emergent issues, a gastroenterologist can be reached at any hour by calling [phone].   DIET:  We do recommend a small meal at first, but then you may proceed to your regular diet.  Drink plenty of fluids but you should avoid alcoholic beverages for 24 hours.  ACTIVITY:  You should plan to take it easy for the rest of today and you should NOT DRIVE or use  heavy machinery until tomorrow (because of the sedation medicines used during the test).    FOLLOW UP: Our staff will call the number listed on your records 48-72 hours following your procedure to check on you and address any questions or concerns that you may have regarding the information given to you following your procedure. If we do not reach you, we will leave a message.  We will attempt to reach you two times.  During this call, we will ask if you have developed any symptoms of COVID 19. If you develop any symptoms (ie: fever, flu-like symptoms, shortness of breath, cough etc.) before then, please call [phone].  If you test positive for Covid 19 in the 2 weeks post procedure, please call and report this information to us.    If any biopsies were taken you will be contacted by phone or by letter within the next 1-3 weeks.  Please call us at [phone] if you have not heard about the biopsies in 3 weeks.    SIGNATURES/CONFIDENTIALITY: You and/or your care partner have signed paperwork which will be entered into your electronic medical record.  These signatures attest to the fact that that the information above on your After Visit Summary has been reviewed and is understood.  Full responsibility of the confidentiality of this discharge information lies with you and/or your care-partner. 

## 2019-08-24 NOTE — Progress Notes (Signed)
Called to room to assist during endoscopic procedure.  Patient ID and intended procedure confirmed with present staff. Received instructions for my participation in the procedure from the performing physician.  

## 2019-08-24 NOTE — Progress Notes (Signed)
Pt's states no medical or surgical changes since previsit or office visit. 

## 2019-08-28 ENCOUNTER — Telehealth: Payer: Self-pay | Admitting: *Deleted

## 2019-08-28 NOTE — Telephone Encounter (Signed)
  Follow up Call-  Call back number 08/24/2019  Post procedure Call Back phone  # [phone]  Permission to leave phone message Yes  Some recent data might be hidden     Patient questions:  Do you have a fever, pain , or abdominal swelling? No. Pain Score  0 *  Have you tolerated food without any problems? Yes.    Have you been able to return to your normal activities? Yes.    Do you have any questions about your discharge instructions: Diet   No. Medications  No. Follow up visit  No.  Do you have questions or concerns about your Care? No.  Actions: * If pain score is 4 or above: No action needed, pain <4.   1. Have you developed a fever since your procedure? no  2.   Have you had an respiratory symptoms (SOB or cough) since your procedure? no  3.   Have you tested positive for COVID 19 since your procedure no  4.   Have you had any family members/close contacts diagnosed with the COVID 19 since your procedure?  no   If yes to any of these questions please route to Joylene John, RN and Alphonsa Gin, Therapist, sports.

## 2019-08-30 ENCOUNTER — Encounter: Payer: Self-pay | Admitting: Gastroenterology

## 2019-08-30 LAB — HM COLONOSCOPY

## 2019-09-03 ENCOUNTER — Other Ambulatory Visit: Payer: BC Managed Care – PPO

## 2019-09-10 ENCOUNTER — Encounter: Payer: Self-pay | Admitting: Cardiology

## 2019-09-10 ENCOUNTER — Ambulatory Visit: Payer: BC Managed Care – PPO | Admitting: Cardiology

## 2019-09-10 ENCOUNTER — Other Ambulatory Visit: Payer: Self-pay

## 2019-09-10 VITALS — BP 131/83 | HR 62 | Temp 98.3°F | Ht 72.0 in | Wt 224.0 lb

## 2019-09-10 DIAGNOSIS — E785 Hyperlipidemia, unspecified: Secondary | ICD-10-CM

## 2019-09-10 DIAGNOSIS — R7303 Prediabetes: Secondary | ICD-10-CM

## 2019-09-10 DIAGNOSIS — R55 Syncope and collapse: Secondary | ICD-10-CM

## 2019-09-10 DIAGNOSIS — I10 Essential (primary) hypertension: Secondary | ICD-10-CM

## 2019-09-10 MED ORDER — METOPROLOL TARTRATE 25 MG PO TABS: 25.0000 mg | ORAL_TABLET | Freq: Two times a day (BID) | ORAL | 2 refills | Status: AC

## 2019-09-10 MED ORDER — ROSUVASTATIN CALCIUM 10 MG PO TABS: 10.0000 mg | ORAL_TABLET | Freq: Every day | ORAL | 3 refills | Status: DC

## 2019-09-10 NOTE — Progress Notes (Signed)
Primary Physician:  Shawnee Knapp, MD   Patient ID: Michael Berger, male    DOB: 1961-06-26, 58 y.o.   MRN: [id number]  Subjective:    Chief Complaint  Patient presents with  . Chest Pain  . Follow-up    4 weeks  . Results    Stress test and Echo    HPI: Michael Berger  is a 58 y.o. male  with hypertensive heart disease without heart failure recently seen by [place] for 2 episodes of chest pain that were concerning for angina. He underwent stress testing and echocardiogram and now presents for follow up.  Patient has recently had 2 episodes of exertional dizziness, sweating, shortness of breath, and mild chest discomfort that improved with sitting and resting. He states he felt very hot during the episodes. Chest pain can feel like it is radiating to under his left arm. He has not had any recent episodes like this since last seen by [place]. Does admit to one episode while walking around a store that he felt very fatigued and had to sit down. No chest pain. He was started on Metoprolol at his last visit and tolerated this well.   Blood pressure has been better controlled. He does have history of prediabetes. He is concerned that he may have diabetic neuropathy due to tingling numbness in his feet.  He is retired Editor, commissioning, he is now working as a DOT Nurse, adult.He does regularly lift weights, but has not done this recently.   Past Medical History:  Diagnosis Date  . Anxiety    past hx of situational anxiety   . BPH with obstruction/lower urinary tract symptoms   . Closed fracture of left lateral malleolus   . GERD (gastroesophageal reflux disease)   . Hypertension   . Sleep apnea    does not wear CPAP  . Stress     Past Surgical History:  Procedure Laterality Date  . APPENDECTOMY     Age 43  . COLONOSCOPY    . ORIF ANKLE FRACTURE Left 11/23/2017   Procedure: OPEN REDUCTION INTERNAL FIXATION (ORIF) LEFT LATERAL MALLEOLUS;  Surgeon: Leandrew Koyanagi, MD;  Location: [facility];  Service: Orthopedics;  Laterality: Left;  . POLYPECTOMY      Social History   Socioeconomic History  . Marital status: Married    Spouse name: Not on file  . Number of children: 1  . Years of education: Not on file  . Highest education level: Not on file  Occupational History  . 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 Financial resource strain: Not on file  . Food insecurity    Worry: Not on file    Inability: Not on file  . Transportation needs    Medical: Not on file    Non-medical: Not on file  Tobacco Use  . Smoking status: Never Smoker  . Smokeless tobacco: Never Used  . Tobacco comment: cigars- once yearly  Substance and Sexual Activity  . Alcohol use: Yes    Alcohol/week: 0.0 standard drinks    Comment: Rare; holidays  . Drug use: No  . Sexual activity: Yes  Lifestyle  . Physical activity    Days per week: Not on file    Minutes per session: Not on file  . Stress: Not on file  Relationships  . Social connections    Talks on phone: Not on file  Gets together: Not on file    Attends religious service: Not on file    Active member of club or organization: Not on file    Attends meetings of clubs or organizations: Not on file    Relationship status: Not on file  . Intimate partner violence    Fear of current or ex partner: Not on file    Emotionally abused: Not on file    Physically abused: Not on file    Forced sexual activity: Not on file  Other Topics Concern  . Not on file  Social History Narrative   Exercises 3 x's weekly.   Drinks coffee about 8 times a month    Review of Systems  Constitution: Negative for decreased appetite, malaise/fatigue, weight gain and weight loss.  Eyes: Negative for visual disturbance.  Cardiovascular: Positive for near-syncope. Negative for chest pain, claudication, dyspnea on exertion, leg swelling, orthopnea, palpitations and syncope.   Respiratory: Negative for hemoptysis and wheezing.   Endocrine: Negative for cold intolerance and heat intolerance.  Hematologic/Lymphatic: Does not bruise/bleed easily.  Skin: Negative for nail changes.  Musculoskeletal: Negative for muscle weakness and myalgias.  Gastrointestinal: Negative for abdominal pain, change in bowel habit, nausea and vomiting.  Neurological: Negative for difficulty with concentration, dizziness, focal weakness and headaches.  Psychiatric/Behavioral: Negative for altered mental status and suicidal ideas.  All other systems reviewed and are negative.     Objective:  Blood pressure 131/83, pulse 62, temperature 98.3 F (36.8 C), height 6' (1.829 m), weight 224 lb (101.6 kg), SpO2 98 %. Body mass index is 30.38 kg/m.    Physical Exam  Constitutional: He is oriented to person, place, and time. Vital signs are normal. He appears well-developed and well-nourished.  HENT:  Head: Normocephalic and atraumatic.  Neck: Normal range of motion.  Cardiovascular: Normal rate, regular rhythm, normal heart sounds and intact distal pulses.  Pulmonary/Chest: Effort normal and breath sounds normal. No accessory muscle usage. No respiratory distress.  Abdominal: Soft. Bowel sounds are normal.  Musculoskeletal: Normal range of motion.  Neurological: He is alert and oriented to person, place, and time.  Skin: Skin is warm and dry.  Vitals reviewed.  Radiology: No results found.  Laboratory examination:    CMP Latest Ref Rng & Units 11/23/2017 07/06/2017 10/18/2016  Glucose 65 - 99 mg/dL 103(H) 89 91  BUN 6 - 20 mg/dL 32(H) 18 11  Creatinine 0.61 - 1.24 mg/dL 1.20 1.34(H) 1.27  Sodium 135 - 145 mmol/L 137 140 143  Potassium 3.5 - 5.1 mmol/L 5.5(H) 4.0 4.3  Chloride 101 - 111 mmol/L 104 100 103  CO2 20 - 29 mmol/L - 22 19  Calcium 8.7 - 10.2 mg/dL - 10.1 9.9  Total Protein 6.0 - 8.5 g/dL - 8.6(H) 8.0  Total Bilirubin 0.0 - 1.2 mg/dL - 0.4 0.4  Alkaline Phos 39 - 117 IU/L  - 61 71  AST 0 - 40 IU/L - 26 26  ALT 0 - 44 IU/L - 33 32   CBC Latest Ref Rng & Units 11/23/2017 07/06/2017 10/18/2016  WBC 4.6 - 10.2 K/uL - 7.0 9.6  Hemoglobin 13.0 - 17.0 g/dL 19.4(H) 18.5(A) 18.2(H)  Hematocrit 39.0 - 52.0 % 57.0(H) 55.4(A) 52.1(H)  Platelets 150 - 379 x10E3/uL - - 223   Lipid Panel     Component Value Date/Time   CHOL 193 07/06/2013 1701   TRIG 142 07/06/2013 1701   HDL 40 07/06/2013 1701   CHOLHDL 4.8 07/06/2013 1701   VLDL  28 07/06/2013 1701   LDLCALC 125 (H) 07/06/2013 1701   HEMOGLOBIN A1C No results found for: HGBA1C, MPG TSH No results for input(s): TSH in the last 8760 hours.  PRN Meds:. Medications Discontinued During This Encounter  Medication Reason  . metoprolol tartrate (LOPRESSOR) 25 MG tablet Reorder   Current Meds  Medication Sig  . aspirin EC 81 MG tablet Take 1 tablet (81 mg total) by mouth daily.  Marland Kitchen L-Arginine POWD Take by mouth. Pre workout drink  . metoprolol tartrate (LOPRESSOR) 25 MG tablet Take 1 tablet (25 mg total) by mouth 2 (two) times daily.  . pantoprazole (PROTONIX) 40 MG tablet Take 1 tablet (40 mg total) by mouth 2 (two) times daily.  . tadalafil (CIALIS) 5 MG tablet Take 1 tablet by mouth once daily  . [DISCONTINUED] metoprolol tartrate (LOPRESSOR) 25 MG tablet Take 1 tablet (25 mg total) by mouth 2 (two) times daily.    Cardiac Studies:   Echocardiogram 08/13/2019: Left ventricle cavity is normal in size. Mild concentric hypertrophy of the left ventricle. Normal LV systolic function with EF 55%. Normal global wall motion. Normal diastolic filling pattern. Calculated EF 55%. Trileaflet aortic valve. Trace aortic regurgitation.Mild (Grade I) mitral regurgitation. IVC is dilated with respiratory variation. Estimated RA pressure 8 mmHg. No evidence of pulmonary hypertension. Compared to previous study in 2014, trace aortic regurgitation is new. No other significant change noted.   Lexiscan Sestamibi Stress Test  08/20/2019: Non-diagnostic ECG stress due to pharmacologic stress test.  Normal myocardial perfusion. Stress LV EF: 67%.  No previous exam available for comparison. Low risk stress test.   Assessment:   Essential hypertension - Plan: CANCELED: EKG 12-Lead  Near syncope  Prediabetes  Mild hyperlipidemia - Plan: Lipid Profile, Comprehensive Metabolic Panel (CMET)  EKG 08/02/2019: Normal sinus rhythm at 80 bpm, normal axis, Q waves in inferior leads suggestive of possible old inferior infarct. No evidence of ischemia. Abnormal EKG  Recommendations:   I have discussed recently obtained stress test and echocardiogram results. Given his reassuring cardiac workup, feel that his episodes and symptoms are likely related to vasovagal etiology or potentially hypertension as he has had improvement in his symptoms with better blood pressure control. I have recommended continued watchful waiting for now. Advised that if he should have recurrence of symptoms, he should sit or lay down, use counter pressure maneuvers, and drink fluids to help prevent syncopal episode. He also mentions that previously his episodes seemed to improve with eating, making it questionable if his symptoms potentially related to hypoglycemia. I have reviewed his labs from PCP office, he is prediabetic. I have encouraged him to follow up with her regarding this.   Lipids are mildly elevated despite diet compliance and exercise; therefore, I have recommended Crestor 10 mg daily. Will check CMP and lipids prior to his next office visit. We will see him back in 3 months for follow up or sooner if needed.     Michael Dunn, MSN, APRN, FNP-C [facility] [place] Office: [phone] Fax: [phone]

## 2019-09-11 ENCOUNTER — Encounter: Payer: Self-pay | Admitting: Cardiology

## 2019-09-11 NOTE — Progress Notes (Signed)
Labs 05/29/2019: Cholesterol 188, triglycerides 105, HDL 51, LDL 116.  RBC 6.02, CBC otherwise normal.  Potassium 4.0, creatinine 1.29, EGFR 71, CMP normal.  Testosterone normal.  PSA normal.  Hemoglobin A1c 5.9%.  TSH normal.

## 2019-09-24 NOTE — Progress Notes (Signed)
Rp in 7 yrs

## 2019-11-17 ENCOUNTER — Other Ambulatory Visit: Payer: Self-pay | Admitting: Cardiology

## 2019-12-10 ENCOUNTER — Encounter: Payer: Self-pay | Admitting: Cardiology

## 2019-12-10 ENCOUNTER — Ambulatory Visit: Payer: BC Managed Care – PPO

## 2019-12-10 ENCOUNTER — Ambulatory Visit: Payer: BC Managed Care – PPO | Admitting: Cardiology

## 2019-12-10 ENCOUNTER — Other Ambulatory Visit: Payer: Self-pay

## 2019-12-10 VITALS — BP 135/88 | HR 73 | Temp 98.2°F | Ht 72.0 in | Wt 229.0 lb

## 2019-12-10 DIAGNOSIS — R55 Syncope and collapse: Secondary | ICD-10-CM

## 2019-12-10 DIAGNOSIS — G4733 Obstructive sleep apnea (adult) (pediatric): Secondary | ICD-10-CM

## 2019-12-10 DIAGNOSIS — E785 Hyperlipidemia, unspecified: Secondary | ICD-10-CM

## 2019-12-10 DIAGNOSIS — I10 Essential (primary) hypertension: Secondary | ICD-10-CM

## 2019-12-10 NOTE — Progress Notes (Signed)
Primary Physician/Referring:  Donald Prose, MD  Patient ID: Michael Berger, male    DOB: 1961-06-29, 60 y.o.   MRN: [id number]  Chief Complaint  Patient presents with  . Hypertension  . Near Syncope   HPI:    Michael Berger  is a 59 y.o. African-American male with hypertension with hypertensive heart disease, chest pain suggestive of angina pectoris which is resolved with treatment with beta-blocker therapy, presents here for follow-up of hypertension, episodes of dizziness and near syncope.  He continues to have episodes of sudden onset dizziness especially when he bends down and stands up.  Also he has noticed while he is laying down he suddenly feels like everything is black for a second or 2.  No chest pain, no shortness of breath associated with the symptoms.  He does have sleep apnea but not on CPAP.  Past medical history significant for hyperglycemia, hyperlipidemia and hypertension.    He is retired Editor, commissioning, he is now working as a [employer] Nurse, adult.He does regularly lift weights, but has not done this recently.   Past Medical History:  Diagnosis Date  . Anxiety    past hx of situational anxiety   . BPH with obstruction/lower urinary tract symptoms   . Closed fracture of left lateral malleolus   . GERD (gastroesophageal reflux disease)   . Hypertension   . Sleep apnea    does not wear CPAP  . Stress    Past Surgical History:  Procedure Laterality Date  . APPENDECTOMY     Age 71  . COLONOSCOPY    . ORIF ANKLE FRACTURE Left 11/23/2017   Procedure: OPEN REDUCTION INTERNAL FIXATION (ORIF) LEFT LATERAL MALLEOLUS;  Surgeon: Leandrew Koyanagi, MD;  Location: [place];  Service: Orthopedics;  Laterality: Left;  . POLYPECTOMY     Family History  Problem Relation Age of Onset  . Hypertension Mother   . Colon cancer Neg Hx   . Esophageal cancer Neg Hx   . Stomach cancer Neg Hx   . Rectal cancer Neg Hx   . Colon polyps Neg Hx     Social History    Tobacco Use  . Smoking status: Never Smoker  . Smokeless tobacco: Never Used  . Tobacco comment: cigars- once yearly  Substance Use Topics  . Alcohol use: Yes    Alcohol/week: 0.0 standard drinks    Comment: Rare; holidays   ROS  Review of Systems  Cardiovascular: Positive for near-syncope. Negative for chest pain, dyspnea on exertion and leg swelling.  Respiratory: Positive for snoring (not on CPAP).   Gastrointestinal: Negative for melena.  Neurological: Positive for dizziness.   Objective  Blood pressure 135/88, pulse 73, temperature 98.2 F (36.8 C), height 6' (1.829 m), weight 229 lb (103.9 kg), SpO2 96 %.  Vitals with BMI 12/10/2019 09/10/2019 08/24/2019  Height _0  _1  -  Weight 229 lbs 224 lbs -  BMI 24.82 50.03 -  Systolic 704 888 86  Diastolic 88 83 51  Pulse 73 62 58     Physical Exam  Cardiovascular: Normal rate, regular rhythm, normal heart sounds and intact distal pulses. Exam reveals no gallop.  No murmur heard. No leg edema, no JVD.  Pulmonary/Chest: Effort normal and breath sounds normal.  Abdominal: Soft. Bowel sounds are normal.   Laboratory examination:   No results for input(s): NA, K, CL, CO2, GLUCOSE, BUN, CREATININE, CALCIUM, GFRNONAA, GFRAA in the last 8760 hours. CrCl cannot be calculated (Patient's most recent  lab result is older than the maximum 21 days allowed.).  CMP Latest Ref Rng & Units 11/23/2017 07/06/2017 10/18/2016  Glucose 65 - 99 mg/dL 103(H) 89 91  BUN 6 - 20 mg/dL 32(H) 18 11  Creatinine 0.61 - 1.24 mg/dL 1.20 1.34(H) 1.27  Sodium 135 - 145 mmol/L 137 140 143  Potassium 3.5 - 5.1 mmol/L 5.5(H) 4.0 4.3  Chloride 101 - 111 mmol/L 104 100 103  CO2 20 - 29 mmol/L - 22 19  Calcium 8.7 - 10.2 mg/dL - 10.1 9.9  Total Protein 6.0 - 8.5 g/dL - 8.6(H) 8.0  Total Bilirubin 0.0 - 1.2 mg/dL - 0.4 0.4  Alkaline Phos 39 - 117 IU/L - 61 71  AST 0 - 40 IU/L - 26 26  ALT 0 - 44 IU/L - 33 32   CBC Latest Ref Rng & Units 11/23/2017 07/06/2017  10/18/2016  WBC 4.6 - 10.2 K/uL - 7.0 9.6  Hemoglobin 13.0 - 17.0 g/dL 19.4(H) 18.5(A) 18.2(H)  Hematocrit 39.0 - 52.0 % 57.0(H) 55.4(A) 52.1(H)  Platelets 150 - 379 x10E3/uL - - 223   Lipid Panel     Component Value Date/Time   CHOL 193 07/06/2013 1701   TRIG 142 07/06/2013 1701   HDL 40 07/06/2013 1701   CHOLHDL 4.8 07/06/2013 1701   VLDL 28 07/06/2013 1701   LDLCALC 125 (H) 07/06/2013 1701   HEMOGLOBIN A1C No results found for: HGBA1C, MPG TSH No results for input(s): TSH in the last 8760 hours.  External labs:   Labs 05/29/2019: Cholesterol 188, triglycerides 105, HDL 51, LDL 116.  RBC 6.02, CBC otherwise normal.  Potassium 4.0, creatinine 1.29, EGFR 71, CMP normal.  Testosterone normal.  PSA normal.  Hemoglobin A1c 5.9%.  TSH normal.  Medications and allergies   Allergies  Allergen Reactions  . Other Rash    apples     Current Outpatient Medications  Medication Instructions  . aspirin EC 81 mg, Oral, Daily  . L-Arginine POWD Oral, Pre workout drink   . losartan-hydrochlorothiazide (HYZAAR) 100-25 MG tablet 1 tablet, Oral, Daily  . metoprolol tartrate (LOPRESSOR) 25 mg, Oral, 2 times daily  . pantoprazole (PROTONIX) 40 mg, Oral, 2 times daily  . rosuvastatin (CRESTOR) 10 MG tablet TAKE 1 TABLET BY MOUTH EVERY DAY  . tadalafil (CIALIS) 5 MG tablet Take 1 tablet by mouth once daily    Radiology:   No results found.  Cardiac Studies:   Echocardiogram 08/13/2019: Left ventricle cavity is normal in size. Mild concentric hypertrophy of the left ventricle. Normal LV systolic function with EF 55%. Normal global wall motion. Normal diastolic filling pattern. Calculated EF 55%. Trileaflet aortic valve. Trace aortic regurgitation.Mild (Grade I) mitral regurgitation. IVC is dilated with respiratory variation. Estimated RA pressure 8 mmHg. No evidence of pulmonary hypertension. Compared to previous study in 2014, trace aortic regurgitation is new. No other significant change  noted.   Lexiscan Sestamibi Stress Test 08/20/2019: Non-diagnostic ECG stress due to pharmacologic stress test.  Normal myocardial perfusion. Stress LV EF: 67%.  No previous exam available for comparison. Low risk stress test.  EKG 12/10/2019: Normal sinus rhythm at rate of 71 bpm, left atrial enlargement, cannot exclude inferior infarct old.  Low-voltage complexes.  Abnormal EKG. No significant change from  EKG 08/02/2019.  Assessment     ICD-10-CM   1. Near syncope  R55 EKG 12-Lead    CARDIAC EVENT MONITOR  2. Mild hyperlipidemia  E78.5   3. Essential hypertension  I10   4.  Obstructive sleep apnea  G47.33     No orders of the defined types were placed in this encounter.   There are no discontinued medications.   Recommendations:   Michael Berger  is a 59 y.o. African-American male with hypertension with hypertensive heart disease, chest pain suggestive of angina pectoris which is resolved with treatment with beta-blocker therapy, presents here for follow-up of hypertension, episodes of dizziness and near syncope.  He continues to have episodes of sudden onset dizziness especially when he bends down and stands up.  Also he has noticed while he is laying down he suddenly feels like everything is black for a second or 2.  No chest pain, no shortness of breath associated with the symptoms.  He does have sleep apnea but not on CPAP.  Past medical history significant for hyperglycemia, hyperlipidemia and hypertension.   I have discussed recently obtained stress test and echocardiogram results. Given his reassuring cardiac workup, feel that his episodes and symptoms are likely related to vasovagal etiology or potentially hypertension as he has had improvement in his symptoms with better blood pressure control and also he has not had any chest pain since being on Metoprolol.   Patient's episodes of brief "blackout" that lasted a second or 2 are unexplained.  I do not know if these are related  to sleep apnea or rhythm disorder.  He has never had a syncope, episodes are never occurred when he is upright.  I will set up for a Event/Holter monitor for 30 days for near syncope. Explained how to use it and to activate the device.  He also has symptoms of dizziness when he suddenly stands up. Advised that if he should have recurrence of symptoms, he should sit or lay down, use counter pressure maneuvers, and drink fluids to help prevent syncopal episode.  Symptoms are reproducible and hence not too concerned about these.  Physiology explained.  Lipids are mildly elevated despite diet compliance and exercise; therefore, I had recommended Crestor 10 mg daily. Will check CMP and lipids prior to his next office visit, lab orders are in place. We will see him back in 6 weeks for follow up or sooner if needed.   I have discussed with him regarding the importance of treatment of sleep apnea.  Advised him to follow-up with sleep clinic to rediscuss initiation of CPAP.  Importance with regarding hypoglycemia, control of hypertension and arrhythmias discussed with regard to CPAP.  This was a 40-minute office visit encounter.  He is establishing with Dr. Donald Prose, MD for PCP needs and will forward her a copy of my visit.  Adrian Prows, MD, [facility] 12/11/2019, 2:27 PM [facility]. [place] Office: [phone]

## 2020-01-04 LAB — COMPREHENSIVE METABOLIC PANEL WITH GFR
ALT: 29 IU/L (ref 0–44)
AST: 20 IU/L (ref 0–40)
Albumin/Globulin Ratio: 1.5 (ref 1.2–2.2)
Albumin: 4.7 g/dL (ref 3.8–4.9)
Alkaline Phosphatase: 60 IU/L (ref 39–117)
BUN/Creatinine Ratio: 16 (ref 9–20)
BUN: 20 mg/dL (ref 6–24)
Bilirubin Total: 0.3 mg/dL (ref 0.0–1.2)
CO2: 24 mmol/L (ref 20–29)
Calcium: 9.7 mg/dL (ref 8.7–10.2)
Chloride: 103 mmol/L (ref 96–106)
Creatinine, Ser: 1.27 mg/dL (ref 0.76–1.27)
GFR calc Af Amer: 73 mL/min/1.73
GFR calc non Af Amer: 63 mL/min/1.73
Globulin, Total: 3.1 g/dL (ref 1.5–4.5)
Glucose: 84 mg/dL (ref 65–99)
Potassium: 4.4 mmol/L (ref 3.5–5.2)
Sodium: 143 mmol/L (ref 134–144)
Total Protein: 7.8 g/dL (ref 6.0–8.5)

## 2020-01-04 LAB — LIPID PANEL
Chol/HDL Ratio: 2.7 ratio (ref 0.0–5.0)
Cholesterol, Total: 111 mg/dL (ref 100–199)
HDL: 41 mg/dL
LDL Chol Calc (NIH): 51 mg/dL (ref 0–99)
Triglycerides: 101 mg/dL (ref 0–149)
VLDL Cholesterol Cal: 19 mg/dL (ref 5–40)

## 2020-01-23 ENCOUNTER — Ambulatory Visit: Payer: BC Managed Care – PPO | Admitting: Cardiology

## 2020-02-06 ENCOUNTER — Ambulatory Visit: Payer: BC Managed Care – PPO | Admitting: Cardiology

## 2020-02-06 NOTE — Progress Notes (Signed)
Primary Physician/Referring:  Donald Prose, MD  Patient ID: Michael Berger, male    DOB: 01/15/1961, 59 y.o.   MRN: [id number]  Chief Complaint  Patient presents with  . Near Syncope  . Hypertension  . Hyperlipidemia  . Follow-up    chest pain   HPI:    Michael Berger  is a 59 y.o. African-American male with hypertension with hypertensive heart disease, chest pain suggestive of angina pectoris which is resolved with treatment with beta-blocker therapy, presents here for follow-up of hypertension, episodes of dizziness and near syncope.  He continues to have episodes of sudden onset dizziness especially when he bends down and stands up.  Also he has noticed while he is laying down he suddenly feels like everything is black for a second or 2.  No chest pain, no shortness of breath associated with the symptoms.  He does have sleep apnea but not on CPAP.  Past medical history significant for hyperglycemia, hyperlipidemia and hypertension.    He is retired Editor, commissioning, he is now working as a [employer] Nurse, adult.  I had obtained lipid profile testing event monitor and he presents for follow-up.  He was supposed to see me in 2 weeks but preformed his visit due to chest pain, described as discomfort in the left side of the chest mostly at rest lasting for 10 to 15 minutes, no other associated symptoms but had episodes continuously for 3 days and hence was concerned.  Past Medical History:  Diagnosis Date  . Anxiety    past hx of situational anxiety   . BPH with obstruction/lower urinary tract symptoms   . Closed fracture of left lateral malleolus   . GERD (gastroesophageal reflux disease)   . Hypertension   . Sleep apnea    does not wear CPAP  . Stress    Past Surgical History:  Procedure Laterality Date  . APPENDECTOMY     Age 59  . COLONOSCOPY    . ORIF ANKLE FRACTURE Left 11/23/2017   Procedure: OPEN REDUCTION INTERNAL FIXATION (ORIF) LEFT LATERAL MALLEOLUS;  Surgeon: Leandrew Koyanagi, MD;  Location: [place];  Service: Orthopedics;  Laterality: Left;  . POLYPECTOMY     Family History  Problem Relation Age of Onset  . Hypertension Mother   . Colon cancer Neg Hx   . Esophageal cancer Neg Hx   . Stomach cancer Neg Hx   . Rectal cancer Neg Hx   . Colon polyps Neg Hx     Social History   Tobacco Use  . Smoking status: Never Smoker  . Smokeless tobacco: Never Used  . Tobacco comment: cigars- once yearly  Substance Use Topics  . Alcohol use: Yes    Alcohol/week: 0.0 standard drinks    Comment: Rare; holidays   Marital Status: Married  ROS  Review of Systems  Cardiovascular: Positive for near-syncope. Negative for chest pain, dyspnea on exertion and leg swelling.  Respiratory: Positive for snoring (not on CPAP).   Gastrointestinal: Negative for melena.  Neurological: Positive for dizziness.   Objective  Blood pressure 113/78, pulse 72, temperature 98.3 F (36.8 C), height 6' (1.829 m), weight 223 lb 9.6 oz (101.4 kg), SpO2 96 %.  Vitals with BMI 02/07/2020 12/10/2019 09/10/2019  Height 6' 0" 6' 0" 6' 0"  Weight 223 lbs 10 oz 229 lbs 224 lbs  BMI 30.32 93.57 01.77  Systolic 939 030 092  Diastolic 78 88 83  Pulse 72 73 62  Physical Exam  Cardiovascular: Normal rate, regular rhythm, normal heart sounds and intact distal pulses. Exam reveals no gallop.  No murmur heard. No leg edema, no JVD.  Pulmonary/Chest: Effort normal and breath sounds normal.  Abdominal: Soft. Bowel sounds are normal.   Laboratory examination:   Recent Labs    01/03/20 1437  NA 143  K 4.4  CL 103  CO2 24  GLUCOSE 84  BUN 20  CREATININE 1.27  CALCIUM 9.7  GFRNONAA 63  GFRAA 73   CrCl cannot be calculated (Patient's most recent lab result is older than the maximum 21 days allowed.).  CMP Latest Ref Rng & Units 01/03/2020 11/23/2017 07/06/2017  Glucose 65 - 99 mg/dL 84 103(H) 89  BUN 6 - 24 mg/dL 20 32(H) 18  Creatinine 0.76 - 1.27 mg/dL 1.27 1.20  1.34(H)  Sodium 134 - 144 mmol/L 143 137 140  Potassium 3.5 - 5.2 mmol/L 4.4 5.5(H) 4.0  Chloride 96 - 106 mmol/L 103 104 100  CO2 20 - 29 mmol/L 24 - 22  Calcium 8.7 - 10.2 mg/dL 9.7 - 10.1  Total Protein 6.0 - 8.5 g/dL 7.8 - 8.6(H)  Total Bilirubin 0.0 - 1.2 mg/dL 0.3 - 0.4  Alkaline Phos 39 - 117 IU/L 60 - 61  AST 0 - 40 IU/L 20 - 26  ALT 0 - 44 IU/L 29 - 33   CBC Latest Ref Rng & Units 02/08/2020 11/23/2017 07/06/2017  WBC 3.4 - 10.8 x10E3/uL 8.9 - 7.0  Hemoglobin 13.0 - 17.7 g/dL 16.7 19.4(H) 18.5(A)  Hematocrit 37.5 - 51.0 % 50.5 57.0(H) 55.4(A)  Platelets 150 - 450 x10E3/uL 209 - -   Lipid Panel     Component Value Date/Time   CHOL 111 01/03/2020 1437   TRIG 101 01/03/2020 1437   HDL 41 01/03/2020 1437   CHOLHDL 2.7 01/03/2020 1437   CHOLHDL 4.8 07/06/2013 1701   VLDL 28 07/06/2013 1701   LDLCALC 51 01/03/2020 1437   HEMOGLOBIN A1C No results found for: HGBA1C, MPG TSH No results for input(s): TSH in the last 8760 hours.  External labs:   05/29/2019: Cholesterol 188, triglycerides 105, HDL 51, LDL 116.  RBC 6.02, CBC otherwise normal.  Potassium 4.0, creatinine 1.29, EGFR 71, CMP normal.  Testosterone normal.  PSA normal.  Hemoglobin A1c 5.9%.  TSH normal.  Medications and allergies   Allergies  Allergen Reactions  . Other Rash    apples     Current Outpatient Medications  Medication Instructions  . aspirin EC 81 mg, Oral, Daily  . L-Arginine POWD Oral, Pre workout drink   . losartan (COZAAR) 100 mg, Oral, Daily, Discontinue Losartan HCT  . metoprolol tartrate (LOPRESSOR) 25 mg, Oral, 2 times daily  . pantoprazole (PROTONIX) 40 mg, Oral, 2 times daily  . rosuvastatin (CRESTOR) 10 MG tablet TAKE 1 TABLET BY MOUTH EVERY DAY  . tadalafil (CIALIS) 5 MG tablet Take 1 tablet by mouth once daily    Radiology:   No results found.  Cardiac Studies:   Echocardiogram 08/13/2019: Left ventricle cavity is normal in size. Mild concentric hypertrophy of the left  ventricle. Normal LV systolic function with EF 55%. Normal global wall motion. Normal diastolic filling pattern. Calculated EF 55%. Trileaflet aortic valve. Trace aortic regurgitation.Mild (Grade I) mitral regurgitation. IVC is dilated with respiratory variation. Estimated RA pressure 8 mmHg. No evidence of pulmonary hypertension. Compared to previous study in 2014, trace aortic regurgitation is new. No other significant change noted.   Lexiscan Sestamibi Stress  Test 08/20/2019: Non-diagnostic ECG stress due to pharmacologic stress test.  Normal myocardial perfusion. Stress LV EF: 67%.  No previous exam available for comparison. Low risk stress test.  Event Monitor for 30 days Start date 12/10/2019 - 01/08/2020:  Predominant rhythm is normal sinus rhythm.  There is no atrial fibrillation, no heart block.  There were rare PACs and PVCs.  No symptoms reported.  EKG   EKG 02/07/2020: Normal sinus rhythm at rate of 68 bpm, normal axis, no evidence of ischemia, borderline low voltage complexes, otherwise normal EKG.   No significant change from /10/2019   Assessment     ICD-10-CM   1. Chest pain of uncertain etiology  V69.7 EKG 12-Lead  2. Near syncope  R55   3. Mild hyperlipidemia  E78.5   4. Essential hypertension  I10 losartan (COZAAR) 100 MG tablet  5. Obstructive sleep apnea  G47.33   6. Polycythemia vera (HCC)  D45 aspirin EC 81 MG tablet    CBC    CANCELED: Ambulatory referral to Hematology    Meds ordered this encounter  Medications  . losartan (COZAAR) 100 MG tablet    Sig: Take 1 tablet (100 mg total) by mouth daily. Discontinue Losartan HCT    Dispense:  90 tablet    Refill:  1  . aspirin EC 81 MG tablet    Sig: Take 1 tablet (81 mg total) by mouth daily.    Dispense:  90 tablet    Refill:  3    Medications Discontinued During This Encounter  Medication Reason  . losartan-hydrochlorothiazide (HYZAAR) 100-25 MG tablet Change in therapy  . aspirin EC 81 MG tablet  Change in therapy  . aspirin EC 81 MG tablet Change in therapy    Recommendations:   Michael Berger  is a 59 y.o. African-American male with hypertension with hypertensive heart disease, chest pain suggestive of angina pectoris which is resolved with treatment with beta-blocker therapy, presents here for follow-up of hypertension, episodes of dizziness and near syncope.  He continues to have episodes of sudden onset dizziness especially when he bends down and stands up.  Also he has noticed while he is laying down he suddenly feels like everything is black for a second or 2.  No chest pain, no shortness of breath associated with the symptoms.  He does have sleep apnea but not on CPAP.  Past medical history significant for hyperglycemia, hyperlipidemia and hypertension.    I am seeing him in 2 weeks at office previous scheduled appointment due to chest pain, clearly the chest pain appears to be musculoskeletal.  No change in the EKG, he has had a negative nuclear stress test in November 2020 also.  With regard to his symptoms dizziness and questionable presyncope, not feeling well in general, may be related to polycythemia.  He was on testosterone pellets which he has discontinued many years ago.  Suspect he may have polycythemia, will repeat CBC, I will also refer him to hematology for evaluation of polycythemia as well.  I discontinued losartan HCT and switch him to plain losartan to reduce risk of dehydration.  Otherwise from cardiac standpoint he is stable, I will see him back on a as needed basis, he is aware that he can easily contact me through [employer].  Addendum: CBC normal with Hb upper limit of normal. Will cancel hematology consult and see how he does with changing losartan HCT to plain Losartan. I will inform patient about cancelled appointment. Suspect the polycythemia was  related to prior testosterone supplements.   Adrian Prows, MD, [facility] 02/09/2020, 3:41 PM [facility].  [place] Office: [phone]

## 2020-02-07 ENCOUNTER — Other Ambulatory Visit: Payer: Self-pay

## 2020-02-07 ENCOUNTER — Ambulatory Visit: Payer: BC Managed Care – PPO | Admitting: Cardiology

## 2020-02-07 ENCOUNTER — Encounter: Payer: Self-pay | Admitting: Cardiology

## 2020-02-07 VITALS — BP 113/78 | HR 72 | Temp 98.3°F | Ht 72.0 in | Wt 223.6 lb

## 2020-02-07 DIAGNOSIS — R55 Syncope and collapse: Secondary | ICD-10-CM

## 2020-02-07 DIAGNOSIS — D45 Polycythemia vera: Secondary | ICD-10-CM

## 2020-02-07 DIAGNOSIS — G4733 Obstructive sleep apnea (adult) (pediatric): Secondary | ICD-10-CM

## 2020-02-07 DIAGNOSIS — I10 Essential (primary) hypertension: Secondary | ICD-10-CM

## 2020-02-07 DIAGNOSIS — R079 Chest pain, unspecified: Secondary | ICD-10-CM

## 2020-02-07 DIAGNOSIS — E785 Hyperlipidemia, unspecified: Secondary | ICD-10-CM

## 2020-02-07 MED ORDER — LOSARTAN POTASSIUM 100 MG PO TABS: 100.0000 mg | ORAL_TABLET | Freq: Every day | ORAL | 1 refills | Status: DC

## 2020-02-07 MED ORDER — ASPIRIN EC 81 MG PO TBEC: 81.0000 mg | DELAYED_RELEASE_TABLET | Freq: Every day | ORAL | 3 refills | Status: AC

## 2020-02-09 LAB — CBC
Hematocrit: 50.5 % (ref 37.5–51.0)
Hemoglobin: 16.7 g/dL (ref 13.0–17.7)
MCH: 27.6 pg (ref 26.6–33.0)
MCHC: 33.1 g/dL (ref 31.5–35.7)
MCV: 84 fL (ref 79–97)
Platelets: 209 10*3/uL (ref 150–450)
RBC: 6.05 x10E6/uL — ABNORMAL HIGH (ref 4.14–5.80)
RDW: 14.7 % (ref 11.6–15.4)
WBC: 8.9 10*3/uL (ref 3.4–10.8)

## 2020-02-21 ENCOUNTER — Ambulatory Visit: Payer: BC Managed Care – PPO | Admitting: Cardiology

## 2020-07-03 ENCOUNTER — Other Ambulatory Visit: Payer: Self-pay | Admitting: Sports Medicine

## 2020-07-03 DIAGNOSIS — M542 Cervicalgia: Secondary | ICD-10-CM

## 2020-07-03 DIAGNOSIS — M4800 Spinal stenosis, site unspecified: Secondary | ICD-10-CM

## 2020-07-11 ENCOUNTER — Other Ambulatory Visit: Payer: Self-pay | Admitting: Cardiology

## 2020-07-11 DIAGNOSIS — I10 Essential (primary) hypertension: Secondary | ICD-10-CM

## 2020-07-26 ENCOUNTER — Ambulatory Visit
Admission: RE | Admit: 2020-07-26 | Discharge: 2020-07-26 | Disposition: A | Discharge status: Home / Self Care | Payer: BC Managed Care – PPO | Source: Ambulatory Visit | Attending: Sports Medicine | Admitting: Sports Medicine

## 2020-07-26 ENCOUNTER — Other Ambulatory Visit: Payer: Self-pay

## 2020-07-26 DIAGNOSIS — M542 Cervicalgia: Secondary | ICD-10-CM

## 2020-07-26 DIAGNOSIS — M4800 Spinal stenosis, site unspecified: Secondary | ICD-10-CM

## 2020-08-12 ENCOUNTER — Other Ambulatory Visit: Payer: Self-pay | Admitting: Sports Medicine

## 2020-08-12 DIAGNOSIS — M25521 Pain in right elbow: Secondary | ICD-10-CM

## 2020-08-23 ENCOUNTER — Ambulatory Visit
Admission: RE | Admit: 2020-08-23 | Discharge: 2020-08-23 | Disposition: A | Discharge status: Home / Self Care | Payer: BC Managed Care – PPO | Source: Ambulatory Visit | Attending: Sports Medicine | Admitting: Sports Medicine

## 2020-08-23 DIAGNOSIS — M25521 Pain in right elbow: Secondary | ICD-10-CM

## 2020-12-15 ENCOUNTER — Ambulatory Visit
Admission: EM | Admit: 2020-12-15 | Discharge: 2020-12-15 | Disposition: A | Discharge status: Home / Self Care | Payer: BC Managed Care – PPO | Attending: Family Medicine | Admitting: Family Medicine

## 2020-12-15 ENCOUNTER — Other Ambulatory Visit: Payer: Self-pay

## 2020-12-15 ENCOUNTER — Ambulatory Visit (INDEPENDENT_AMBULATORY_CARE_PROVIDER_SITE_OTHER): Payer: BC Managed Care – PPO

## 2020-12-15 DIAGNOSIS — S134XXA Sprain of ligaments of cervical spine, initial encounter: Secondary | ICD-10-CM

## 2020-12-15 DIAGNOSIS — M542 Cervicalgia: Secondary | ICD-10-CM | POA: Diagnosis not present

## 2020-12-15 MED ORDER — CYCLOBENZAPRINE HCL 5 MG PO TABS: 5.0000 mg | ORAL_TABLET | Freq: Two times a day (BID) | ORAL | 0 refills | Status: DC | PRN

## 2020-12-15 MED ORDER — IBUPROFEN 800 MG PO TABS
800.0000 mg | ORAL_TABLET | Freq: Once | ORAL | Status: AC
  Administered 2020-12-15: 800 mg via ORAL
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
## 2021-01-11 ENCOUNTER — Other Ambulatory Visit: Payer: Self-pay | Admitting: Cardiology

## 2021-01-11 DIAGNOSIS — I10 Essential (primary) hypertension: Secondary | ICD-10-CM

## 2021-02-24 ENCOUNTER — Other Ambulatory Visit: Payer: Self-pay

## 2021-02-24 ENCOUNTER — Ambulatory Visit
Admission: EM | Admit: 2021-02-24 | Discharge: 2021-02-24 | Disposition: A | Discharge status: Home / Self Care | Payer: BC Managed Care – PPO | Attending: Student | Admitting: Student

## 2021-02-24 ENCOUNTER — Encounter: Payer: Self-pay | Admitting: Emergency Medicine

## 2021-02-24 DIAGNOSIS — J069 Acute upper respiratory infection, unspecified: Secondary | ICD-10-CM

## 2021-02-24 MED ORDER — BENZONATATE 100 MG PO CAPS: 100.0000 mg | ORAL_CAPSULE | Freq: Three times a day (TID) | ORAL | 0 refills | Status: DC

## 2021-02-24 MED ORDER — PROMETHAZINE-DM 6.25-15 MG/5ML PO SYRP: 5.0000 mL | ORAL_SOLUTION | Freq: Four times a day (QID) | ORAL | 0 refills | Status: DC | PRN

## 2021-02-24 NOTE — Discharge Instructions (Addendum)
-  Promethazine DM cough syrup for congestion/cough. This could make you drowsy, so take at night before bed. -Tessalon (Benzonatate) as needed for cough. Take one pill up to 3x daily (every 8 hours) -Dayquil/Mucinex for daytime  -For fevers/chills, bodyaches, headaches- Take Tylenol 1000 mg 3 times daily, and ibuprofen 800 mg 3 times daily with food.  You can take these together, or alternate every 3-4 hours. -Seek additional medical attention if your symptoms get worse instead of better, like new fever/chills, shortness of breath, dizziness, abdominal pain, chest pain, etc.

## 2021-02-24 NOTE — ED Provider Notes (Signed)
[name]    CSN: [id number] Arrival date & time: 02/24/21  1127      History   Chief Complaint Chief Complaint  Patient presents with  . URI    HPI Rashard Ryle is a 60 y.o. male presenting with sore throat, nasal congestion, cough x2 days folowing exposure to [place]. Hasn't tried any OTC medications. Productive cough that's worse at night.  Denies history cardiopulmonary disease. Hasn't monitored temperature at home. Denies fevers/chills, n/v/d, shortness of breath, chest pain,  facial pain, teeth pain, headaches, loss of taste/smell, swollen lymph nodes, ear pain.      HPI  Past Medical History:  Diagnosis Date  . Anxiety    past hx of situational anxiety   . BPH with obstruction/lower urinary tract symptoms   . Closed fracture of left lateral malleolus   . GERD (gastroesophageal reflux disease)   . Hypertension   . Sleep apnea    does not wear CPAP  . Stress     Patient Active Problem List   Diagnosis Date Noted  . History of Helicobacter pylori infection 12/16/2018  . Cough 12/16/2018  . Closed fracture of left lateral malleolus 11/21/2017  . Intractable vomiting with nausea 10/05/2016  . GERD (gastroesophageal reflux disease) 12/02/2015  . OSA on CPAP 12/02/2015  . DDD (degenerative disc disease), cervical 12/02/2015  . DDD (degenerative disc disease), lumbosacral 12/02/2015  . Essential hypertension 04/28/2014  . Erectile dysfunction 04/26/2014  . Obesity, unspecified 07/06/2013    Past Surgical History:  Procedure Laterality Date  . APPENDECTOMY     Age 15  . COLONOSCOPY    . ORIF ANKLE FRACTURE Left 11/23/2017   Procedure: OPEN REDUCTION INTERNAL FIXATION (ORIF) LEFT LATERAL MALLEOLUS;  Surgeon: Leandrew Koyanagi, MD;  Location: [place] [facility];  Service: Orthopedics;  Laterality: Left;  . POLYPECTOMY         Home Medications    Prior to Admission medications   Medication Sig Start Date End Date Taking? Authorizing  Provider  benzonatate (TESSALON) 100 MG capsule Take 1 capsule (100 mg total) by mouth every 8 (eight) hours. 02/24/21  Yes Hazel Sams, PA-C  promethazine-dextromethorphan (PROMETHAZINE-DM) 6.25-15 MG/5ML syrup Take 5 mLs by mouth 4 (four) times daily as needed for cough. 02/24/21  Yes Hazel Sams, PA-C  aspirin EC 81 MG tablet Take 1 tablet (81 mg total) by mouth daily. 02/07/20   Adrian Prows, MD  cyclobenzaprine (FLEXERIL) 5 MG tablet Take 1-2 tablets (5-10 mg total) by mouth 2 (two) times daily as needed for up to 20 doses for muscle spasms. 12/15/20   Scot Jun, FNP  L-Arginine POWD Take by mouth. Pre workout drink    [provider]  losartan (COZAAR) 100 MG tablet TAKE 1 TABLET (100 MG TOTAL) BY MOUTH DAILY. DISCONTINUE LOSARTAN HCT 01/12/21 07/11/21  Adrian Prows, MD  metoprolol tartrate (LOPRESSOR) 25 MG tablet Take 1 tablet (25 mg total) by mouth 2 (two) times daily. 09/10/19 02/07/20  Miquel Dunn, NP  pantoprazole (PROTONIX) 40 MG tablet Take 1 tablet (40 mg total) by mouth 2 (two) times daily. Patient taking differently: Take 40 mg by mouth as needed.  02/04/19   Emeterio Reeve, DO  rosuvastatin (CRESTOR) 10 MG tablet TAKE 1 TABLET BY MOUTH EVERY DAY 11/22/19   Miquel Dunn, NP  tadalafil (CIALIS) 5 MG tablet Take 1 tablet by mouth once daily 02/03/19   Shawnee Knapp, MD    Family History Family History  Problem Relation Age of Onset  . Hypertension Mother   . Colon cancer Neg Hx   . Esophageal cancer Neg Hx   . Stomach cancer Neg Hx   . Rectal cancer Neg Hx   . Colon polyps Neg Hx     Social History Social History   Tobacco Use  . Smoking status: Never Smoker  . Smokeless tobacco: Never Used  . Tobacco comment: cigars- once yearly  Vaping Use  . Vaping Use: Never used  Substance Use Topics  . Alcohol use: Yes    Alcohol/week: 0.0 standard drinks    Comment: Rare; holidays  . Drug use: No     Allergies   Other   Review of  Systems Review of Systems  Constitutional: Negative for appetite change, chills and fever.  HENT: Positive for congestion and sore throat. Negative for ear pain, rhinorrhea, sinus pressure, sinus pain, trouble swallowing and voice change.   Eyes: Negative for redness and visual disturbance.  Respiratory: Positive for cough. Negative for chest tightness, shortness of breath and wheezing.   Cardiovascular: Negative for chest pain and palpitations.  Gastrointestinal: Negative for abdominal pain, constipation, diarrhea, nausea and vomiting.  Genitourinary: Negative for dysuria, frequency and urgency.  Musculoskeletal: Negative for myalgias.  Neurological: Negative for dizziness, weakness and headaches.  Psychiatric/Behavioral: Negative for confusion.  All other systems reviewed and are negative.    Physical Exam Triage Vital Signs ED Triage Vitals [02/24/21 1337]  Enc Vitals Group     BP 135/83     Pulse Rate 82     Resp 18     Temp 98.3 F (36.8 C)     Temp Source Oral     SpO2 97 %     Weight      Height      Head Circumference      Peak Flow      Pain Score 0     Pain Loc      Pain Edu?      Excl. in [place]?    No data found.  Updated Vital Signs BP 135/83 (BP Location: Right Arm)   Pulse 82   Temp 98.3 F (36.8 C) (Oral)   Resp 18   SpO2 97%   Visual Acuity Right Eye Distance:   Left Eye Distance:   Bilateral Distance:    Right Eye Near:   Left Eye Near:    Bilateral Near:     Physical Exam Vitals reviewed.  Constitutional:      General: He is not in acute distress.    Appearance: Normal appearance. He is not ill-appearing.  HENT:     Head: Normocephalic and atraumatic.     Right Ear: Hearing, tympanic membrane, ear canal and external ear normal. No swelling or tenderness. There is no impacted cerumen. No mastoid tenderness. Tympanic membrane is not perforated, erythematous, retracted or bulging.     Left Ear: Hearing, tympanic membrane, ear canal and  external ear normal. No swelling or tenderness. There is no impacted cerumen. No mastoid tenderness. Tympanic membrane is not perforated, erythematous, retracted or bulging.     Nose:     Right Sinus: No maxillary sinus tenderness or frontal sinus tenderness.     Left Sinus: No maxillary sinus tenderness or frontal sinus tenderness.     Mouth/Throat:     Mouth: Mucous membranes are moist.     Pharynx: Uvula midline. Posterior oropharyngeal erythema present. No oropharyngeal exudate.     Tonsils: No tonsillar  exudate.     Comments: Smooth erythema posterior pharynx Cardiovascular:     Rate and Rhythm: Normal rate and regular rhythm.     Heart sounds: Normal heart sounds.  Pulmonary:     Breath sounds: Normal breath sounds and air entry. No wheezing, rhonchi or rales.  Chest:     Chest wall: No tenderness.  Abdominal:     General: Abdomen is flat. Bowel sounds are normal.     Tenderness: There is no abdominal tenderness. There is no guarding or rebound.  Lymphadenopathy:     Cervical: No cervical adenopathy.  Neurological:     General: No focal deficit present.     Mental Status: He is alert and oriented to person, place, and time.  Psychiatric:        Attention and Perception: Attention and perception normal.        Mood and Affect: Mood and affect normal.        Behavior: Behavior normal. Behavior is cooperative.        Thought Content: Thought content normal.        Judgment: Judgment normal.      UC Treatments / Results  Labs (all labs ordered are listed, but only abnormal results are displayed) Labs Reviewed - No data to display  EKG   Radiology No results found.  Procedures Procedures (including critical care time)  Medications Ordered in UC Medications - No data to display  Initial Impression / Assessment and Plan / UC Course  I have reviewed the triage vital signs and the nursing notes.  Pertinent labs & imaging results that were available during my care of  the patient were reviewed by me and considered in my medical decision making (see chart for details).     This patient is a 60 year old male presenting with viral URI symptoms. Today this pt is afebrile nontachycardic nontachypneic, oxygenating well on room air, no wheezes rhonchi or rales.   Promethazine, Tessalon as below.  Recommended he try over-the-counter medications for additional relief.  Declines COVID test.  Recommended isolating for at least 5 days as he is declining test.  Did not provide a work note, as I cannot clear him to return to work without COVID test.  ED return precautions discussed.  Final Clinical Impressions(s) / UC Diagnoses   Final diagnoses:  Viral URI with cough     Discharge Instructions     -Promethazine DM cough syrup for congestion/cough. This could make you drowsy, so take at night before bed. -Tessalon (Benzonatate) as needed for cough. Take one pill up to 3x daily (every 8 hours) -Dayquil/Mucinex for daytime  -For fevers/chills, bodyaches, headaches- Take Tylenol 1000 mg 3 times daily, and ibuprofen 800 mg 3 times daily with food.  You can take these together, or alternate every 3-4 hours. -Seek additional medical attention if your symptoms get worse instead of better, like new fever/chills, shortness of breath, dizziness, abdominal pain, chest pain, etc.    ED Prescriptions    Medication Sig Dispense Auth. Provider   promethazine-dextromethorphan (PROMETHAZINE-DM) 6.25-15 MG/5ML syrup Take 5 mLs by mouth 4 (four) times daily as needed for cough. 118 mL Hazel Sams, PA-C   benzonatate (TESSALON) 100 MG capsule Take 1 capsule (100 mg total) by mouth every 8 (eight) hours. 21 capsule Hazel Sams, PA-C     PDMP not reviewed this encounter.   Hazel Sams, PA-C 02/24/21 1432

## 2021-02-24 NOTE — ED Triage Notes (Addendum)
Patient presents to [facility] for evaluation of 2 days of waking up to a sore throat, nasal congestion, and cough.  Patient states he has episodes of diaphoresis through out the day

## 2021-04-07 ENCOUNTER — Encounter: Payer: Self-pay | Admitting: Emergency Medicine

## 2021-04-07 ENCOUNTER — Ambulatory Visit
Admission: EM | Admit: 2021-04-07 | Discharge: 2021-04-07 | Disposition: A | Discharge status: Home / Self Care | Payer: BC Managed Care – PPO | Attending: Emergency Medicine | Admitting: Emergency Medicine

## 2021-04-07 ENCOUNTER — Other Ambulatory Visit: Payer: Self-pay

## 2021-04-07 DIAGNOSIS — W57XXXA Bitten or stung by nonvenomous insect and other nonvenomous arthropods, initial encounter: Secondary | ICD-10-CM

## 2021-04-07 DIAGNOSIS — R21 Rash and other nonspecific skin eruption: Secondary | ICD-10-CM

## 2021-04-07 DIAGNOSIS — S1096XA Insect bite of unspecified part of neck, initial encounter: Secondary | ICD-10-CM

## 2021-04-07 MED ORDER — PREDNISONE 10 MG (21) PO TBPK: ORAL_TABLET | Freq: Every day | ORAL | 0 refills | Status: DC

## 2021-04-07 MED ORDER — HYDROXYZINE HCL 25 MG PO TABS: 25.0000 mg | ORAL_TABLET | Freq: Four times a day (QID) | ORAL | 0 refills | Status: DC

## 2021-04-07 NOTE — ED Provider Notes (Signed)
[name]    CSN: [id number] Arrival date & time: 04/07/21  0801      History   Chief Complaint Chief Complaint  Patient presents with   Insect Bite    HPI Michael Berger is a 60 y.o. male.   Pt states that he was in yard and noticed something stung him to lt side of neck, then began to see redness, hives, and itching. Getting worse since last night. No sob, no chest pain. Has not taken anything pta.    Past Medical History:  Diagnosis Date   Anxiety    past hx of situational anxiety    BPH with obstruction/lower urinary tract symptoms    Closed fracture of left lateral malleolus    GERD (gastroesophageal reflux disease)    Hypertension    Sleep apnea    does not wear CPAP   Stress     Patient Active Problem List   Diagnosis Date Noted   History of Helicobacter pylori infection 12/16/2018   Cough 12/16/2018   Closed fracture of left lateral malleolus 11/21/2017   Intractable vomiting with nausea 10/05/2016   GERD (gastroesophageal reflux disease) 12/02/2015   OSA on CPAP 12/02/2015   DDD (degenerative disc disease), cervical 12/02/2015   DDD (degenerative disc disease), lumbosacral 12/02/2015   Essential hypertension 04/28/2014   Erectile dysfunction 04/26/2014   Obesity, unspecified 07/06/2013    Past Surgical History:  Procedure Laterality Date   APPENDECTOMY     Age 56   COLONOSCOPY     ORIF ANKLE FRACTURE Left 11/23/2017   Procedure: OPEN REDUCTION INTERNAL FIXATION (ORIF) LEFT LATERAL MALLEOLUS;  Surgeon: Leandrew Koyanagi, MD;  Location: [place] [facility];  Service: Orthopedics;  Laterality: Left;   POLYPECTOMY         Home Medications    Prior to Admission medications   Medication Sig Start Date End Date Taking? Authorizing Provider  hydrOXYzine (ATARAX/VISTARIL) 25 MG tablet Take 1 tablet (25 mg total) by mouth every 6 (six) hours. 04/07/21  Yes Marney Setting, NP  predniSONE (STERAPRED UNI-PAK 21 TAB) 10 MG (21) TBPK  tablet Take by mouth daily. Take 6 tabs by mouth daily  for 2 days, then 5 tabs for 2 days, then 4 tabs for 2 days, then 3 tabs for 2 days, 2 tabs for 2 days, then 1 tab by mouth daily for 2 days 04/07/21  Yes Marney Setting, NP  aspirin EC 81 MG tablet Take 1 tablet (81 mg total) by mouth daily. 02/07/20   Adrian Prows, MD  benzonatate (TESSALON) 100 MG capsule Take 1 capsule (100 mg total) by mouth every 8 (eight) hours. Patient not taking: Reported on 04/07/2021 02/24/21   Hazel Sams, PA-C  cyclobenzaprine (FLEXERIL) 5 MG tablet Take 1-2 tablets (5-10 mg total) by mouth 2 (two) times daily as needed for up to 20 doses for muscle spasms. 12/15/20   Scot Jun, FNP  L-Arginine POWD Take by mouth. Pre workout drink    [provider]  losartan (COZAAR) 100 MG tablet TAKE 1 TABLET (100 MG TOTAL) BY MOUTH DAILY. DISCONTINUE LOSARTAN HCT 01/12/21 07/11/21  Adrian Prows, MD  metoprolol tartrate (LOPRESSOR) 25 MG tablet Take 1 tablet (25 mg total) by mouth 2 (two) times daily. 09/10/19 02/07/20  Miquel Dunn, NP  pantoprazole (PROTONIX) 40 MG tablet Take 1 tablet (40 mg total) by mouth 2 (two) times daily. Patient taking differently: Take 40 mg by mouth as needed.  02/04/19  Emeterio Reeve, DO  promethazine-dextromethorphan (PROMETHAZINE-DM) 6.25-15 MG/5ML syrup Take 5 mLs by mouth 4 (four) times daily as needed for cough. 02/24/21   Hazel Sams, PA-C  rosuvastatin (CRESTOR) 10 MG tablet TAKE 1 TABLET BY MOUTH EVERY DAY 11/22/19   Miquel Dunn, NP  tadalafil (CIALIS) 5 MG tablet Take 1 tablet by mouth once daily 02/03/19   Shawnee Knapp, MD    Family History Family History  Problem Relation Age of Onset   Hypertension Mother    Colon cancer Neg Hx    Esophageal cancer Neg Hx    Stomach cancer Neg Hx    Rectal cancer Neg Hx    Colon polyps Neg Hx     Social History Social History   Tobacco Use   Smoking status: Never   Smokeless tobacco: Never   Tobacco  comments:    cigars- once yearly  Vaping Use   Vaping Use: Never used  Substance Use Topics   Alcohol use: Yes    Alcohol/week: 0.0 standard drinks    Comment: Rare; holidays   Drug use: No     Allergies   Other   Review of Systems Review of Systems  Constitutional: Negative.   HENT: Negative.    Eyes: Negative.   Respiratory: Negative.    Cardiovascular: Negative.   Gastrointestinal: Negative.   Skin:  Positive for rash.  Neurological: Negative.     Physical Exam Triage Vital Signs ED Triage Vitals  Enc Vitals Group     BP 04/07/21 0814 (!) 147/98     Pulse Rate 04/07/21 0814 63     Resp 04/07/21 0814 18     Temp 04/07/21 0814 97.9 F (36.6 C)     Temp Source 04/07/21 0814 Oral     SpO2 04/07/21 0814 97 %     Weight --      Height --      Head Circumference --      Peak Flow --      Pain Score 04/07/21 0815 0     Pain Loc --      Pain Edu? --      Excl. in [place]? --    No data found.  Updated Vital Signs BP (!) 147/98 (BP Location: Left Arm)   Pulse 63   Temp 97.9 F (36.6 C) (Oral)   Resp 18   SpO2 97%   Visual Acuity     Physical Exam Constitutional:      Appearance: Normal appearance.  HENT:     Mouth/Throat:     Mouth: Mucous membranes are moist.  Eyes:     Pupils: Pupils are equal, round, and reactive to light.  Cardiovascular:     Rate and Rhythm: Normal rate.  Pulmonary:     Effort: Pulmonary effort is normal.  Abdominal:     General: Abdomen is flat.  Musculoskeletal:     Cervical back: Normal range of motion.  Skin:    Findings: Erythema and rash present.     Comments: Red rash small raised area to lt side of neck and upper chest. No wound noted.   Neurological:     Mental Status: He is alert.     UC Treatments / Results  Labs (all labs ordered are listed, but only abnormal results are displayed) Labs Reviewed - No data to display  EKG   Radiology No results found.  Procedures Procedures (including critical care  time)  Medications Ordered in UC Medications -  No data to display  Initial Impression / Assessment and Plan / UC Course  I have reviewed the triage vital signs and the nursing notes.  Pertinent labs & imaging results that were available during my care of the patient were reviewed by me and considered in my medical decision making (see chart for details).     Take medications as needed for itching do not drive while taking  May use a hydrocortisone cream also  If sob or chest pain go to the ER    Final Clinical Impressions(s) / UC Diagnoses   Final diagnoses:  Insect bite of neck, initial encounter  Rash and nonspecific skin eruption   Discharge Instructions   None    ED Prescriptions     Medication Sig Dispense Auth. Provider   predniSONE (STERAPRED UNI-PAK 21 TAB) 10 MG (21) TBPK tablet Take by mouth daily. Take 6 tabs by mouth daily  for 2 days, then 5 tabs for 2 days, then 4 tabs for 2 days, then 3 tabs for 2 days, 2 tabs for 2 days, then 1 tab by mouth daily for 2 days 42 tablet Morley Kos L, NP   hydrOXYzine (ATARAX/VISTARIL) 25 MG tablet Take 1 tablet (25 mg total) by mouth every 6 (six) hours. 12 tablet Marney Setting, NP      PDMP not reviewed this encounter.   Marney Setting, NP 04/07/21 [phone]

## 2021-04-07 NOTE — ED Triage Notes (Signed)
Pt here for insect bite to neck on Sunday now having rash that is itchy

## 2021-07-27 ENCOUNTER — Other Ambulatory Visit: Payer: Self-pay | Admitting: Family Medicine

## 2021-09-17 ENCOUNTER — Other Ambulatory Visit: Payer: Self-pay | Admitting: Family Medicine

## 2021-10-15 ENCOUNTER — Encounter: Payer: Self-pay | Admitting: Emergency Medicine

## 2021-10-15 ENCOUNTER — Other Ambulatory Visit: Payer: Self-pay

## 2021-10-15 ENCOUNTER — Ambulatory Visit
Admission: EM | Admit: 2021-10-15 | Discharge: 2021-10-15 | Disposition: A | Discharge status: Home / Self Care | Payer: BC Managed Care – PPO | Attending: Internal Medicine | Admitting: Internal Medicine

## 2021-10-15 DIAGNOSIS — J069 Acute upper respiratory infection, unspecified: Secondary | ICD-10-CM | POA: Diagnosis not present

## 2021-10-15 MED ORDER — PREDNISONE 20 MG PO TABS: 40.0000 mg | ORAL_TABLET | Freq: Every day | ORAL | 0 refills | Status: AC

## 2021-10-15 MED ORDER — BENZONATATE 100 MG PO CAPS: 100.0000 mg | ORAL_CAPSULE | Freq: Three times a day (TID) | ORAL | 0 refills | Status: DC | PRN

## 2021-10-15 NOTE — ED Provider Notes (Signed)
[name]    CSN: [id number] Arrival date & time: 10/15/21  9381      History   Chief Complaint Chief Complaint  Patient presents with   Cough   Shortness of Breath    HPI Michael Berger is a 61 y.o. male.   Patient presents with 2-day history of nasal congestion, nonproductive cough, sneezing, shortness of breath, bilateral eye discomfort, weakness, low back pain. Denies any drainage from the eyes or any blurry vision. Denies any known fevers.  His entire family has similar symptoms currently.  Denies chest pain, sore throat, ear pain, nausea, vomiting, diarrhea, abdominal pain.  Shortness of breath is intermittent and patient reports that it feels like "he cannot catch his breath at times".  Has been taking Delsym and Robitussin with minimal improvement in symptoms.   Cough Shortness of Breath  Past Medical History:  Diagnosis Date   Anxiety    past hx of situational anxiety    BPH with obstruction/lower urinary tract symptoms    Closed fracture of left lateral malleolus    GERD (gastroesophageal reflux disease)    Hypertension    Sleep apnea    does not wear CPAP   Stress     Patient Active Problem List   Diagnosis Date Noted   History of Helicobacter pylori infection 12/16/2018   Cough 12/16/2018   Closed fracture of left lateral malleolus 11/21/2017   Intractable vomiting with nausea 10/05/2016   GERD (gastroesophageal reflux disease) 12/02/2015   OSA on CPAP 12/02/2015   DDD (degenerative disc disease), cervical 12/02/2015   DDD (degenerative disc disease), lumbosacral 12/02/2015   Essential hypertension 04/28/2014   Erectile dysfunction 04/26/2014   Obesity, unspecified 07/06/2013    Past Surgical History:  Procedure Laterality Date   APPENDECTOMY     Age 28   COLONOSCOPY     ORIF ANKLE FRACTURE Left 11/23/2017   Procedure: OPEN REDUCTION INTERNAL FIXATION (ORIF) LEFT LATERAL MALLEOLUS;  Surgeon: Leandrew Koyanagi, MD;  Location: [place] [facility];  Service: Orthopedics;  Laterality: Left;   POLYPECTOMY         Home Medications    Prior to Admission medications   Medication Sig Start Date End Date Taking? Authorizing Provider  benzonatate (TESSALON) 100 MG capsule Take 1 capsule (100 mg total) by mouth every 8 (eight) hours as needed for cough. 10/15/21  Yes Okie Jansson, Hildred Alamin E, FNP  predniSONE (DELTASONE) 20 MG tablet Take 2 tablets (40 mg total) by mouth daily for 5 days. 10/15/21 10/20/21 Yes Teodora Medici, FNP  aspirin EC 81 MG tablet Take 1 tablet (81 mg total) by mouth daily. 02/07/20   Adrian Prows, MD  cyclobenzaprine (FLEXERIL) 5 MG tablet Take 1-2 tablets (5-10 mg total) by mouth 2 (two) times daily as needed for up to 20 doses for muscle spasms. 12/15/20   Scot Jun, FNP  hydrOXYzine (ATARAX/VISTARIL) 25 MG tablet Take 1 tablet (25 mg total) by mouth every 6 (six) hours. 04/07/21   Marney Setting, NP  L-Arginine POWD Take by mouth. Pre workout drink    [provider]  losartan (COZAAR) 100 MG tablet TAKE 1 TABLET (100 MG TOTAL) BY MOUTH DAILY. DISCONTINUE LOSARTAN HCT 01/12/21 07/11/21  Adrian Prows, MD  metoprolol tartrate (LOPRESSOR) 25 MG tablet Take 1 tablet (25 mg total) by mouth 2 (two) times daily. 09/10/19 02/07/20  Miquel Dunn, NP  pantoprazole (PROTONIX) 40 MG tablet Take 1 tablet (40 mg total) by mouth 2 (  two) times daily. Patient taking differently: Take 40 mg by mouth as needed.  02/04/19   Emeterio Reeve, DO  promethazine-dextromethorphan (PROMETHAZINE-DM) 6.25-15 MG/5ML syrup Take 5 mLs by mouth 4 (four) times daily as needed for cough. 02/24/21   Hazel Sams, PA-C  rosuvastatin (CRESTOR) 10 MG tablet TAKE 1 TABLET BY MOUTH EVERY DAY 11/22/19   Miquel Dunn, NP  tadalafil (CIALIS) 5 MG tablet Take 1 tablet by mouth once daily 02/03/19   Shawnee Knapp, MD    Family History Family History  Problem Relation Age of Onset   Hypertension Mother    Colon cancer Neg Hx     Esophageal cancer Neg Hx    Stomach cancer Neg Hx    Rectal cancer Neg Hx    Colon polyps Neg Hx     Social History Social History   Tobacco Use   Smoking status: Never   Smokeless tobacco: Never   Tobacco comments:    cigars- once yearly  Vaping Use   Vaping Use: Never used  Substance Use Topics   Alcohol use: Yes    Alcohol/week: 0.0 standard drinks    Comment: Rare; holidays   Drug use: No     Allergies   Other   Review of Systems Review of Systems Per HPI  Physical Exam Triage Vital Signs ED Triage Vitals  Enc Vitals Group     BP 10/15/21 0836 119/81     Pulse Rate 10/15/21 0836 76     Resp 10/15/21 0836 16     Temp 10/15/21 0836 98.5 F (36.9 C)     Temp Source 10/15/21 0836 Oral     SpO2 10/15/21 0836 95 %     Weight --      Height --      Head Circumference --      Peak Flow --      Pain Score 10/15/21 0838 0     Pain Loc --      Pain Edu? --      Excl. in [place]? --    No data found.  Updated Vital Signs BP 119/81 (BP Location: Left Arm)    Pulse 76    Temp 98.5 F (36.9 C) (Oral)    Resp 16    SpO2 95%   Visual Acuity Right Eye Distance:   Left Eye Distance:   Bilateral Distance:    Right Eye Near:   Left Eye Near:    Bilateral Near:     Physical Exam Constitutional:      General: He is not in acute distress.    Appearance: Normal appearance. He is not toxic-appearing or diaphoretic.  HENT:     Head: Normocephalic and atraumatic.     Right Ear: Tympanic membrane and ear canal normal.     Left Ear: Tympanic membrane and ear canal normal.     Nose: Congestion present.     Mouth/Throat:     Mouth: Mucous membranes are moist.     Pharynx: No posterior oropharyngeal erythema.  Eyes:     General: Lids are normal. Lids are everted, no foreign bodies appreciated. Vision grossly intact. Gaze aligned appropriately.        Right eye: No foreign body, discharge or hordeolum.        Left eye: No foreign body, discharge or hordeolum.      Extraocular Movements: Extraocular movements intact.     Conjunctiva/sclera: Conjunctivae normal.     Pupils: Pupils are equal,  round, and reactive to light.  Cardiovascular:     Rate and Rhythm: Normal rate and regular rhythm.     Pulses: Normal pulses.     Heart sounds: Normal heart sounds.  Pulmonary:     Effort: Pulmonary effort is normal. No respiratory distress.     Breath sounds: Normal breath sounds. No stridor. No wheezing, rhonchi or rales.  Abdominal:     General: Abdomen is flat. Bowel sounds are normal.     Palpations: Abdomen is soft.  Musculoskeletal:        General: Normal range of motion.     Cervical back: Normal range of motion.  Skin:    General: Skin is warm and dry.  Neurological:     General: No focal deficit present.     Mental Status: He is alert and oriented to person, place, and time. Mental status is at baseline.  Psychiatric:        Mood and Affect: Mood normal.        Behavior: Behavior normal.     UC Treatments / Results  Labs (all labs ordered are listed, but only abnormal results are displayed) Labs Reviewed  COVID-19, FLU A+B NAA    EKG   Radiology No results found.  Procedures Procedures (including critical care time)  Medications Ordered in UC Medications - No data to display  Initial Impression / Assessment and Plan / UC Course  I have reviewed the triage vital signs and the nursing notes.  Pertinent labs & imaging results that were available during my care of the patient were reviewed by me and considered in my medical decision making (see chart for details).     Patient presents with symptoms likely from a viral upper respiratory infection. Differential includes bacterial pneumonia, sinusitis, allergic rhinitis, COVID-19, flu. Do not suspect underlying cardiopulmonary process. Symptoms seem unlikely related to ACS, CHF or COPD exacerbations, pneumonia, pneumothorax. Patient is nontoxic appearing and not in need of emergent  medical intervention.  COVID 19 and flu test pending.  Eyes appear normal.  No red flags on exam and no signs of conjunctivitis.  Suspect that patient is having pressure in eyes due to upper respiratory symptoms.  Do not think that chest imaging is necessary given no adventitious lung sounds on exam but patient was offered chest imaging.  Patient declined with shared decision making.  Recommended symptom control with over the counter medications.  Will prescribe prednisone to help alleviate inflammation and shortness of breath as patient reports that he has taken this previously and has tolerated well before.  Benzonatate to take as needed for cough as well.  Return if symptoms fail to improve in 1-2 weeks.  Patient states understanding and is agreeable.  Discharged with PCP followup.  Final Clinical Impressions(s) / UC Diagnoses   Final diagnoses:  Viral upper respiratory tract infection with cough     Discharge Instructions      Your COVID and flu test is pending.  We will call if it is positive.  It appears that you have a viral upper respiratory infection.  You have been prescribed prednisone and a cough medication.    ED Prescriptions     Medication Sig Dispense Auth. Provider   predniSONE (DELTASONE) 20 MG tablet Take 2 tablets (40 mg total) by mouth daily for 5 days. 10 tablet [place], [place] E, [place]   benzonatate (TESSALON) 100 MG capsule Take 1 capsule (100 mg total) by mouth every 8 (eight) hours as needed for  cough. 21 capsule Teodora Medici, [place]      PDMP not reviewed this encounter.   Teodora Medici, [place] 10/15/21 [phone]

## 2021-10-15 NOTE — Discharge Instructions (Signed)
Your COVID and flu test is pending.  We will call if it is positive.  It appears that you have a viral upper respiratory infection.  You have been prescribed prednisone and a cough medication.

## 2021-10-15 NOTE — ED Triage Notes (Signed)
Travelled last week, Tuesday became congested, coughing, sneezing, feels SOB, c/o bilateral eye pain, weakness, low back pain. Denies N/V/D. States he feels like he can't catch his breath. Taking delsym and robitussin.

## 2021-10-16 LAB — COVID-19, FLU A+B NAA
Influenza A, NAA: NOT DETECTED
Influenza B, NAA: NOT DETECTED
SARS-CoV-2, NAA: NOT DETECTED

## 2021-10-27 ENCOUNTER — Other Ambulatory Visit: Payer: Self-pay | Admitting: Family Medicine

## 2021-10-27 DIAGNOSIS — Z711 Person with feared health complaint in whom no diagnosis is made: Secondary | ICD-10-CM

## 2021-11-20 ENCOUNTER — Ambulatory Visit
Admission: RE | Admit: 2021-11-20 | Discharge: 2021-11-20 | Disposition: A | Discharge status: Home / Self Care | Payer: BC Managed Care – PPO | Source: Ambulatory Visit | Attending: Family Medicine | Admitting: Family Medicine

## 2021-11-20 ENCOUNTER — Other Ambulatory Visit: Payer: Self-pay | Admitting: Family Medicine

## 2021-11-20 DIAGNOSIS — Z711 Person with feared health complaint in whom no diagnosis is made: Secondary | ICD-10-CM

## 2021-11-20 MED ORDER — IOPAMIDOL (ISOVUE-300) INJECTION 61%
100.0000 mL | Freq: Once | INTRAVENOUS | Status: AC | PRN
Start: 2021-11-20 — End: 2021-11-20
  Administered 2021-11-20: 100 mL via INTRAVENOUS

## 2022-02-05 ENCOUNTER — Ambulatory Visit (INDEPENDENT_AMBULATORY_CARE_PROVIDER_SITE_OTHER): Payer: BC Managed Care – PPO

## 2022-02-05 ENCOUNTER — Ambulatory Visit
Admission: EM | Admit: 2022-02-05 | Discharge: 2022-02-05 | Disposition: A | Discharge status: Home / Self Care | Payer: BC Managed Care – PPO | Attending: Urgent Care | Admitting: Urgent Care

## 2022-02-05 ENCOUNTER — Ambulatory Visit: Payer: BC Managed Care – PPO

## 2022-02-05 ENCOUNTER — Encounter: Payer: Self-pay | Admitting: Urgent Care

## 2022-02-05 DIAGNOSIS — M542 Cervicalgia: Secondary | ICD-10-CM | POA: Diagnosis not present

## 2022-02-05 DIAGNOSIS — S161XXA Strain of muscle, fascia and tendon at neck level, initial encounter: Secondary | ICD-10-CM | POA: Diagnosis not present

## 2022-02-05 DIAGNOSIS — M47812 Spondylosis without myelopathy or radiculopathy, cervical region: Secondary | ICD-10-CM

## 2022-02-05 MED ORDER — IBUPROFEN 800 MG PO TABS: 800.0000 mg | ORAL_TABLET | Freq: Three times a day (TID) | ORAL | 0 refills | Status: DC

## 2022-02-05 MED ORDER — TIZANIDINE HCL 4 MG PO TABS: 4.0000 mg | ORAL_TABLET | Freq: Every day | ORAL | 0 refills | Status: DC
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
## 2022-03-18 ENCOUNTER — Other Ambulatory Visit: Payer: Self-pay | Admitting: Orthopedic Surgery

## 2022-03-18 DIAGNOSIS — M5020 Other cervical disc displacement, unspecified cervical region: Secondary | ICD-10-CM

## 2022-03-21 ENCOUNTER — Ambulatory Visit
Admission: RE | Admit: 2022-03-21 | Discharge: 2022-03-21 | Disposition: A | Discharge status: Home / Self Care | Payer: BC Managed Care – PPO | Source: Ambulatory Visit | Attending: Orthopedic Surgery | Admitting: Orthopedic Surgery

## 2022-03-21 DIAGNOSIS — M5020 Other cervical disc displacement, unspecified cervical region: Secondary | ICD-10-CM
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
## 2023-02-23 ENCOUNTER — Ambulatory Visit: Payer: BC Managed Care – PPO | Admitting: Pulmonary Disease

## 2023-02-23 ENCOUNTER — Encounter: Payer: Self-pay | Admitting: Pulmonary Disease

## 2023-02-23 VITALS — BP 122/84 | HR 63 | Ht 72.0 in | Wt 232.0 lb

## 2023-02-23 DIAGNOSIS — G4733 Obstructive sleep apnea (adult) (pediatric): Secondary | ICD-10-CM

## 2023-02-23 DIAGNOSIS — R053 Chronic cough: Secondary | ICD-10-CM | POA: Diagnosis not present

## 2023-02-23 MED ORDER — PANTOPRAZOLE SODIUM 40 MG PO TBEC: 40.0000 mg | DELAYED_RELEASE_TABLET | Freq: Every day | ORAL | 3 refills | Status: DC

## 2023-02-23 NOTE — Progress Notes (Signed)
Michael Berger    [id number]    1961-05-20  Primary Care Physician:Sun, Charise Carwin, MD  Referring Physician: Deatra James, MD [phone] [place],  [place] [address]  Chief complaint:   Patient being seen for chronic cough  HPI:  Chronic cough, however 2 months Did not feel acutely ill when it started  Does have a history of reflux Has not been taking Protonix recently  Was evaluated for cough a few years back as well Does not have nasal stuffiness or congestion, no history of allergies No history of asthma  Never smoker  He does have a history of sleep apnea Last study was over 10 years ago Use CPAP for a few years  Has not been using it recently  He does snore Study in the past was with severe obstructive sleep apnea, had mask issues, moves around a lot  Usually tries to go to bed about 6 to 7 PM, falls asleep immediately wakes up initially about 10 and then goes back to sleep and finally wakes up about 2:45 AM Does not feel rested when he wakes up in the morning  Is a retired Emergency planning/management officer  He does get short of breath with activity Has been having some occasional chest discomfort Has seen cardiology in the past, recommend that he follows up with cardiology  He has hypertension that is well-controlled  He is sleepy during the day Does have dryness of his mouth in the morning   Outpatient Encounter Medications as of 02/23/2023  Medication Sig   aspirin EC 81 MG tablet Take 1 tablet (81 mg total) by mouth daily.   cyclobenzaprine (FLEXERIL) 5 MG tablet Take 1-2 tablets (5-10 mg total) by mouth 2 (two) times daily as needed for up to 20 doses for muscle spasms.   hydrOXYzine (ATARAX/VISTARIL) 25 MG tablet Take 1 tablet (25 mg total) by mouth every 6 (six) hours.   ibuprofen (ADVIL) 800 MG tablet Take 1 tablet (800 mg total) by mouth 3 (three) times daily.   L-Arginine POWD Take by mouth. Pre workout drink   pantoprazole (PROTONIX) 40 MG  tablet Take 1 tablet (40 mg total) by mouth 2 (two) times daily. (Patient taking differently: Take 40 mg by mouth as needed.)   pantoprazole (PROTONIX) 40 MG tablet Take 1 tablet (40 mg total) by mouth daily.   rosuvastatin (CRESTOR) 10 MG tablet TAKE 1 TABLET BY MOUTH EVERY DAY   tadalafil (CIALIS) 5 MG tablet Take 1 tablet by mouth once daily   tiZANidine (ZANAFLEX) 4 MG tablet Take 1 tablet (4 mg total) by mouth at bedtime.   benzonatate (TESSALON) 100 MG capsule Take 1 capsule (100 mg total) by mouth every 8 (eight) hours as needed for cough. (Patient not taking: Reported on 02/23/2023)   losartan (COZAAR) 100 MG tablet TAKE 1 TABLET (100 MG TOTAL) BY MOUTH DAILY. DISCONTINUE LOSARTAN HCT   metoprolol tartrate (LOPRESSOR) 25 MG tablet Take 1 tablet (25 mg total) by mouth 2 (two) times daily.   promethazine-dextromethorphan (PROMETHAZINE-DM) 6.25-15 MG/5ML syrup Take 5 mLs by mouth 4 (four) times daily as needed for cough. (Patient not taking: Reported on 02/23/2023)   No facility-administered encounter medications on file as of 02/23/2023.    Allergies as of 02/23/2023 - Review Complete 02/23/2023  Allergen Reaction Noted   Apple (diagnostic) Rash 02/23/2023   Other Rash 08/08/2015    Past Medical History:  Diagnosis Date   Anxiety  past hx of situational anxiety    BPH with obstruction/lower urinary tract symptoms    Closed fracture of left lateral malleolus    GERD (gastroesophageal reflux disease)    Hypertension    Sleep apnea    does not wear CPAP   Stress     Past Surgical History:  Procedure Laterality Date   APPENDECTOMY     Age 39   COLONOSCOPY     ORIF ANKLE FRACTURE Left 11/23/2017   Procedure: OPEN REDUCTION INTERNAL FIXATION (ORIF) LEFT LATERAL MALLEOLUS;  Surgeon: Tarry Kos, MD;  Location: [facility];  Service: Orthopedics;  Laterality: Left;   POLYPECTOMY      Family History  Problem Relation Age of Onset   Hypertension Mother    Colon  cancer Neg Hx    Esophageal cancer Neg Hx    Stomach cancer Neg Hx    Rectal cancer Neg Hx    Colon polyps Neg Hx     Social History   Socioeconomic History   Marital status: Married    Spouse name: Not on file   Number of children: 1   Years of education: Not on file   Highest education level: Not on file  Occupational History   Occupation: Diplomatic Services operational officer: UNEMPLOYED    Comment: [employer]  Tobacco Use   Smoking status: Never   Smokeless tobacco: Never   Tobacco comments:    cigars- once yearly  Vaping Use   Vaping Use: Never used  Substance and Sexual Activity   Alcohol use: Yes    Alcohol/week: 0.0 standard drinks of alcohol    Comment: Rare; holidays   Drug use: No   Sexual activity: Yes  Other Topics Concern   Not on file  Social History Narrative   Exercises 3 x's weekly.   Drinks coffee about 8 times a month   Social Determinants of Corporate investment banker Strain: Not on file  Food Insecurity: Not on file  Transportation Needs: Not on file  Physical Activity: Not on file  Stress: Not on file  Social Connections: Not on file  Intimate Partner Violence: Not on file    Review of Systems  Respiratory:  Positive for apnea, cough and shortness of breath.   Psychiatric/Behavioral:  Positive for sleep disturbance.     Vitals:   02/23/23 1507  BP: 122/84  Pulse: 63  SpO2: 97%     Physical Exam Constitutional:      Appearance: He is obese.  HENT:     Head: Normocephalic.     Mouth/Throat:     Mouth: Mucous membranes are moist.  Eyes:     General: No scleral icterus.    Pupils: Pupils are equal, round, and reactive to light.  Cardiovascular:     Rate and Rhythm: Normal rate and regular rhythm.     Heart sounds: No murmur heard.    No friction rub.  Pulmonary:     Effort: No respiratory distress.     Breath sounds: No stridor. No wheezing or rhonchi.  Musculoskeletal:     Cervical back: No rigidity or tenderness.   Neurological:     Mental Status: He is alert.  Psychiatric:        Mood and Affect: Mood normal.       02/23/2023    3:00 PM  Results of the Epworth flowsheet  Sitting and reading 3  Watching TV 3  Sitting, inactive in a  public place (e.g. a theatre or a meeting) 3  As a passenger in a car for an hour without a break 3  Lying down to rest in the afternoon when circumstances permit 3  Sitting and talking to someone 3  Sitting quietly after a lunch without alcohol 3  In a car, while stopped for a few minutes in traffic 2  Total score 23   Data Reviewed: Previous sleep study was in 2017, performed at [facility] Was set up with CPAP of 10  Assessment:  Chronic cough -Cough likely related to untreated reflux -Will start him back on his Protonix  GERD -Protonix daily  Class I obesity -Encouraged to work on losing weight -Regular graded exercises  Untreated severe obstructive sleep apnea -Last sleep study was in 2017 -Will need a new home sleep study to reconfirm sleep disordered breathing and options of treatment discussed today  Pathophysiology of sleep disordered breathing discussed with the patient Treatment options discussed  Risk with not treating sleep disordered breathing discussed with the patient today  Has a history of polycythemia -This may be related to chronic hypoxemia at night  History of hypertension -Controlled  Shortness of breath -No underlying lung disease known to patient -Never smoker -Will obtain a pulmonary function test  Plan/Recommendations: Schedule for PFT  Schedule for home sleep test  Graded activities as tolerated  Prescription for Protonix 40 daily sent in  I did encourage him to check his machine and if the machine is working well he can resume using his current machine  Weight loss efforts encouraged  I will see him back in about 3 months  Encouraged to call with significant concerns   Virl Diamond  MD [facility] 02/23/2023, 3:37 PM  CC: Deatra James, MD

## 2023-02-23 NOTE — Patient Instructions (Signed)
We will schedule you for breathing study  We will schedule you for sleep study  Try and start up your old machine to see whether it is working well  Prescription for medication for reflux will be sent to pharmacy for you  I will see you back in about 3 months  We will let you know what the sleep study shows as soon as it is reviewed  If you continue to have chest discomfort, make sure you follow-up with a cardiologist  Regular exercises as tolerated

## 2023-02-24 ENCOUNTER — Ambulatory Visit (INDEPENDENT_AMBULATORY_CARE_PROVIDER_SITE_OTHER): Payer: BC Managed Care – PPO | Admitting: Pulmonary Disease

## 2023-02-24 DIAGNOSIS — R053 Chronic cough: Secondary | ICD-10-CM

## 2023-02-24 LAB — PULMONARY FUNCTION TEST
DL/VA % pred: 127 %
DL/VA: 5.38 ml/min/mmHg/L
DLCO cor % pred: 85 %
DLCO cor: 25.13 ml/min/mmHg
DLCO unc % pred: 85 %
DLCO unc: 25.13 ml/min/mmHg
FEF 25-75 Post: 3.38 L/sec
FEF 25-75 Pre: 3.12 L/sec
FEF2575-%Change-Post: 8 %
FEF2575-%Pred-Post: 105 %
FEF2575-%Pred-Pre: 97 %
FEV1-%Change-Post: 3 %
FEV1-%Pred-Post: 71 %
FEV1-%Pred-Pre: 69 %
FEV1-Post: 2.79 L
FEV1-Pre: 2.69 L
FEV1FVC-%Change-Post: 2 %
FEV1FVC-%Pred-Pre: 113 %
FEV6-%Change-Post: 1 %
FEV6-%Pred-Post: 64 %
FEV6-%Pred-Pre: 62 %
FEV6-Post: 3.16 L
FEV6-Pre: 3.1 L
FEV6FVC-%Change-Post: 0 %
FEV6FVC-%Pred-Post: 104 %
FEV6FVC-%Pred-Pre: 104 %
FVC-%Change-Post: 1 %
FVC-%Pred-Post: 61 %
FVC-%Pred-Pre: 60 %
FVC-Post: 3.16 L
FVC-Pre: 3.11 L
Post FEV1/FVC ratio: 88 %
Post FEV6/FVC ratio: 100 %
Pre FEV1/FVC ratio: 86 %
Pre FEV6/FVC Ratio: 100 %
RV % pred: 97 %
RV: 2.27 L
TLC % pred: 71 %
TLC: 5.29 L

## 2023-02-24 NOTE — Progress Notes (Signed)
Full PFT performed today. °

## 2023-02-24 NOTE — Patient Instructions (Signed)
Full PFT performed today. °

## 2023-02-28 ENCOUNTER — Telehealth: Payer: Self-pay | Admitting: Pulmonary Disease

## 2023-02-28 NOTE — Telephone Encounter (Signed)
Patient would like results of PFT. Patient phone number is [phone].

## 2023-02-28 NOTE — Telephone Encounter (Signed)
PFT shows moderate restriction  Smaller lung volumes No significant obstruction in the breathing tubes  -Oxygen exchange is good -Sometimes this is seen as a result of being overweight

## 2023-02-28 NOTE — Telephone Encounter (Signed)
Dr. Val Eagle can you please advise on PFT results?

## 2023-03-01 NOTE — Telephone Encounter (Signed)
Called pt, no answer. LVMM for pt to call back the office for pft results

## 2023-03-01 NOTE — Telephone Encounter (Signed)
Patient is returning phone call. Patient phone number is [phone].

## 2023-03-02 NOTE — Telephone Encounter (Signed)
Went over PFT results with patient.  Patient is wanting to know how he can improve breathing. States sometimes short winded with talking or activity. Also wants to know what his ideal weight should be.   Dr. Wynona Neat please advise?

## 2023-03-10 NOTE — Telephone Encounter (Signed)
Breathing can always be improved with graded exercises, either walking, running, biking, anything that challenges the breathing overall.  Weight and weight management can be addressed with his primary care doctor

## 2023-03-13 ENCOUNTER — Encounter (HOSPITAL_COMMUNITY): Payer: Self-pay

## 2023-03-13 ENCOUNTER — Emergency Department (HOSPITAL_COMMUNITY)
Admission: EM | Admit: 2023-03-13 | Discharge: 2023-03-13 | Disposition: A | Discharge status: Home / Self Care | Payer: BC Managed Care – PPO | Attending: Emergency Medicine | Admitting: Emergency Medicine

## 2023-03-13 ENCOUNTER — Emergency Department (HOSPITAL_COMMUNITY): Payer: BC Managed Care – PPO

## 2023-03-13 DIAGNOSIS — R0602 Shortness of breath: Secondary | ICD-10-CM | POA: Diagnosis present

## 2023-03-13 DIAGNOSIS — Z79899 Other long term (current) drug therapy: Secondary | ICD-10-CM | POA: Diagnosis not present

## 2023-03-13 DIAGNOSIS — Z7982 Long term (current) use of aspirin: Secondary | ICD-10-CM | POA: Insufficient documentation

## 2023-03-13 DIAGNOSIS — R002 Palpitations: Secondary | ICD-10-CM

## 2023-03-13 DIAGNOSIS — J168 Pneumonia due to other specified infectious organisms: Secondary | ICD-10-CM | POA: Insufficient documentation

## 2023-03-13 DIAGNOSIS — J189 Pneumonia, unspecified organism: Secondary | ICD-10-CM

## 2023-03-13 DIAGNOSIS — I10 Essential (primary) hypertension: Secondary | ICD-10-CM | POA: Insufficient documentation

## 2023-03-13 DIAGNOSIS — Z20822 Contact with and (suspected) exposure to covid-19: Secondary | ICD-10-CM | POA: Insufficient documentation

## 2023-03-13 DIAGNOSIS — R051 Acute cough: Secondary | ICD-10-CM

## 2023-03-13 LAB — CBC WITH DIFFERENTIAL/PLATELET
Abs Immature Granulocytes: 0.01 10*3/uL (ref 0.00–0.07)
Basophils Absolute: 0 10*3/uL (ref 0.0–0.1)
Basophils Relative: 1 %
Eosinophils Absolute: 0.2 10*3/uL (ref 0.0–0.5)
Eosinophils Relative: 2 %
HCT: 48.8 % (ref 39.0–52.0)
Hemoglobin: 16.1 g/dL (ref 13.0–17.0)
Immature Granulocytes: 0 %
Lymphocytes Relative: 45 %
Lymphs Abs: 3.1 10*3/uL (ref 0.7–4.0)
MCH: 27.8 pg (ref 26.0–34.0)
MCHC: 33 g/dL (ref 30.0–36.0)
MCV: 84.3 fL (ref 80.0–100.0)
Monocytes Absolute: 0.8 10*3/uL (ref 0.1–1.0)
Monocytes Relative: 12 %
Neutro Abs: 2.8 10*3/uL (ref 1.7–7.7)
Neutrophils Relative %: 40 %
Platelets: 199 10*3/uL (ref 150–400)
RBC: 5.79 MIL/uL (ref 4.22–5.81)
RDW: 14.2 % (ref 11.5–15.5)
WBC: 6.9 10*3/uL (ref 4.0–10.5)
nRBC: 0 % (ref 0.0–0.2)

## 2023-03-13 LAB — COMPREHENSIVE METABOLIC PANEL
ALT: 33 U/L (ref 0–44)
AST: 32 U/L (ref 15–41)
Albumin: 4.4 g/dL (ref 3.5–5.0)
Alkaline Phosphatase: 49 U/L (ref 38–126)
Anion gap: 13 (ref 5–15)
BUN: 17 mg/dL (ref 6–20)
CO2: 20 mmol/L — ABNORMAL LOW (ref 22–32)
Calcium: 8.8 mg/dL — ABNORMAL LOW (ref 8.9–10.3)
Chloride: 104 mmol/L (ref 98–111)
Creatinine, Ser: 1.08 mg/dL (ref 0.61–1.24)
GFR, Estimated: >60 mL/min (ref >60)
Glucose, Bld: 151 mg/dL — ABNORMAL HIGH (ref 70–99)
Potassium: 3.2 mmol/L — ABNORMAL LOW (ref 3.5–5.1)
Sodium: 137 mmol/L (ref 135–145)
Total Bilirubin: 0.6 mg/dL (ref 0.3–1.2)
Total Protein: 7.6 g/dL (ref 6.5–8.1)

## 2023-03-13 LAB — RESP PANEL BY RT-PCR (RSV, FLU A&B, COVID)  RVPGX2
Influenza A by PCR: NEGATIVE
Influenza B by PCR: NEGATIVE
Resp Syncytial Virus by PCR: NEGATIVE
SARS Coronavirus 2 by RT PCR: NEGATIVE

## 2023-03-13 LAB — CBG MONITORING, ED: Glucose-Capillary: 123 mg/dL — ABNORMAL HIGH (ref 70–99)

## 2023-03-13 LAB — MAGNESIUM: Magnesium: 1.8 mg/dL (ref 1.7–2.4)

## 2023-03-13 LAB — D-DIMER, QUANTITATIVE: D-Dimer, Quant: <0.27 ug/mL-FEU (ref 0.00–0.50)

## 2023-03-13 LAB — TROPONIN I (HIGH SENSITIVITY): Troponin I (High Sensitivity): 6 ng/L (ref <18)

## 2023-03-13 LAB — TSH: TSH: 0.968 u[IU]/mL (ref 0.350–4.500)

## 2023-03-13 MED ORDER — DOXYCYCLINE HYCLATE 100 MG PO CAPS: 100.0000 mg | ORAL_CAPSULE | Freq: Two times a day (BID) | ORAL | 0 refills | Status: AC

## 2023-03-13 NOTE — ED Notes (Signed)
Patient transported to X-ray 

## 2023-03-13 NOTE — ED Notes (Signed)
ED Provider at bedside. 

## 2023-03-13 NOTE — ED Provider Notes (Signed)
[place] EMERGENCY DEPARTMENT AT [facility] Provider Note   CSN: [id number] Arrival date & time: 03/13/23  1318     History  Chief Complaint  Patient presents with   Shortness of Breath    Michael Berger is a 62 y.o. male.  The history is provided by the patient and medical records. No language interpreter was used.  Shortness of Breath Severity:  Moderate Onset quality:  Sudden Timing:  Sporadic Progression:  Resolved Chronicity:  New Context: URI   Relieved by:  Nothing Worsened by:  Coughing Ineffective treatments:  None tried Associated symptoms: cough   Associated symptoms: no abdominal pain, no chest pain, no fever, no headaches, no neck pain, no rash, no sputum production, no vomiting and no wheezing        Home Medications Prior to Admission medications   Medication Sig Start Date End Date Taking? Authorizing Provider  aspirin EC 81 MG tablet Take 1 tablet (81 mg total) by mouth daily. 02/07/20   Yates Decamp, MD  benzonatate (TESSALON) 100 MG capsule Take 1 capsule (100 mg total) by mouth every 8 (eight) hours as needed for cough. Patient not taking: Reported on 02/23/2023 10/15/21   Gustavus Bryant, FNP  cyclobenzaprine (FLEXERIL) 5 MG tablet Take 1-2 tablets (5-10 mg total) by mouth 2 (two) times daily as needed for up to 20 doses for muscle spasms. 12/15/20   Bing Neighbors, NP  hydrOXYzine (ATARAX/VISTARIL) 25 MG tablet Take 1 tablet (25 mg total) by mouth every 6 (six) hours. 04/07/21   Coralyn Mark, NP  ibuprofen (ADVIL) 800 MG tablet Take 1 tablet (800 mg total) by mouth 3 (three) times daily. 02/05/22   Wallis Bamberg, PA-C  L-Arginine POWD Take by mouth. Pre workout drink    [provider]  losartan (COZAAR) 100 MG tablet TAKE 1 TABLET (100 MG TOTAL) BY MOUTH DAILY. DISCONTINUE LOSARTAN HCT 01/12/21 07/11/21  Yates Decamp, MD  metoprolol tartrate (LOPRESSOR) 25 MG tablet Take 1 tablet (25 mg total) by mouth 2 (two) times daily. 09/10/19  02/07/20  Toniann Fail, NP  pantoprazole (PROTONIX) 40 MG tablet Take 1 tablet (40 mg total) by mouth 2 (two) times daily. Patient taking differently: Take 40 mg by mouth as needed. 02/04/19   Sunnie Nielsen, DO  pantoprazole (PROTONIX) 40 MG tablet Take 1 tablet (40 mg total) by mouth daily. 02/23/23   Tomma Lightning, MD  promethazine-dextromethorphan (PROMETHAZINE-DM) 6.25-15 MG/5ML syrup Take 5 mLs by mouth 4 (four) times daily as needed for cough. Patient not taking: Reported on 02/23/2023 02/24/21   Rhys Martini, PA-C  rosuvastatin (CRESTOR) 10 MG tablet TAKE 1 TABLET BY MOUTH EVERY DAY 11/22/19   Toniann Fail, NP  tadalafil (CIALIS) 5 MG tablet Take 1 tablet by mouth once daily 02/03/19   Sherren Mocha, MD  tiZANidine (ZANAFLEX) 4 MG tablet Take 1 tablet (4 mg total) by mouth at bedtime. 02/05/22   Wallis Bamberg, PA-C      Allergies    Apple (diagnostic) and Other    Review of Systems   Review of Systems  Constitutional:  Positive for fatigue. Negative for chills and fever.  HENT:  Negative for congestion.   Eyes:  Negative for visual disturbance.  Respiratory:  Positive for cough, chest tightness and shortness of breath. Negative for sputum production and wheezing.   Cardiovascular:  Positive for palpitations. Negative for chest pain and leg swelling.  Gastrointestinal:  Positive for nausea. Negative for  abdominal pain, constipation, diarrhea and vomiting.  Genitourinary:  Negative for dysuria and flank pain.  Musculoskeletal:  Negative for back pain, neck pain and neck stiffness.  Skin:  Negative for rash and wound.  Neurological:  Positive for light-headedness. Negative for dizziness, facial asymmetry, weakness and headaches.  Psychiatric/Behavioral:  Negative for agitation and confusion.   All other systems reviewed and are negative.   Physical Exam Updated Vital Signs BP (!) 173/98   Pulse 79   Temp 97.6 F (36.4 C) (Oral)   Resp 20   SpO2 100%   Physical Exam Vitals and nursing note reviewed.  Constitutional:      General: He is not in acute distress.    Appearance: He is well-developed. He is not ill-appearing, toxic-appearing or diaphoretic.  HENT:     Head: Normocephalic and atraumatic.     Mouth/Throat:     Mouth: Mucous membranes are moist.  Eyes:     Conjunctiva/sclera: Conjunctivae normal.     Pupils: Pupils are equal, round, and reactive to light.  Cardiovascular:     Rate and Rhythm: Normal rate and regular rhythm.     Pulses: Normal pulses.     Heart sounds: No murmur heard. Pulmonary:     Effort: Pulmonary effort is normal. No tachypnea or respiratory distress.     Breath sounds: Rhonchi present. No decreased breath sounds, wheezing or rales.  Chest:     Chest wall: No tenderness.  Abdominal:     Palpations: Abdomen is soft.     Tenderness: There is no abdominal tenderness.  Musculoskeletal:        General: No swelling.     Cervical back: Neck supple.     Right lower leg: No tenderness. No edema.     Left lower leg: No tenderness. No edema.  Skin:    General: Skin is warm and dry.     Capillary Refill: Capillary refill takes less than 2 seconds.     Findings: No erythema.  Neurological:     General: No focal deficit present.     Mental Status: He is alert.  Psychiatric:        Mood and Affect: Mood normal.     ED Results / Procedures / Treatments   Labs (all labs ordered are listed, but only abnormal results are displayed) Labs Reviewed  COMPREHENSIVE METABOLIC PANEL - Abnormal; Notable for the following components:      Result Value   Potassium 3.2 (*)    CO2 20 (*)    Glucose, Bld 151 (*)    Calcium 8.8 (*)    All other components within normal limits  CBG MONITORING, ED - Abnormal; Notable for the following components:   Glucose-Capillary 123 (*)    All other components within normal limits  RESP PANEL BY RT-PCR (RSV, FLU A&B, COVID)  [id number]  CBC WITH DIFFERENTIAL/PLATELET  D-DIMER,  QUANTITATIVE  TSH  MAGNESIUM  TROPONIN I (HIGH SENSITIVITY)  TROPONIN I (HIGH SENSITIVITY)    EKG EKG Interpretation  Date/Time:  Sunday March 13 2023 13:23:15 EDT Ventricular Rate:  87 PR Interval:  189 QRS Duration: 94 QT Interval:  363 QTC Calculation: 437 R Axis:   4 Text Interpretation: Sinus rhythm Consider left atrial enlargement Low voltage, precordial leads no prior ECG for comparison. No STEMI Confirmed by Theda Belfast ([id number]) on 03/13/2023 2:21:15 PM  Radiology DG Chest 2 View  Result Date: 03/13/2023 CLINICAL DATA:  Shortness of breath EXAM: CHEST - 2 VIEW COMPARISON:  Chest x-ray dated July 06, 2017 FINDINGS: The heart size and mediastinal contours are within normal limits. Subtle left-greater-than-right lower lung airspace opacities. The visualized skeletal structures are unremarkable. IMPRESSION: Subtle left-greater-than-right lower lung airspace opacities, concerning for infection or aspiration. Electronically Signed   By: Allegra Lai M.D.   On: 03/13/2023 14:26    Procedures Procedures    Medications Ordered in ED Medications - No data to display  ED Course/ Medical Decision Making/ A&P                             Medical Decision Making Amount and/or Complexity of Data Reviewed Labs: ordered. Radiology: ordered.  Risk Prescription drug management.    Michael Berger is a 62 y.o. male with a past medical history significant for hypertension, anxiety, GERD, previous appendectomy, and sleep apnea who presents for several days of cough as well as an episode of palpitations, chest tightness, shortness of breath, and feeling anxious this afternoon.  According to patient, he has had cough for few days but did not think much of it.  No focal shortness of breath until he was leaving a movie theater today and had an episode of palpitations and tightness.  He said he had a lot of Pepsi drink with caffeine and thinks that sentiment to an episode of panic  attack.  He has had these before that feels lightheaded, anxious, palpitations, and chest tightness and shortness of breath.  He denies focal chest pain.  Denies any vomiting but he had some nausea.  Denies any constipation, diarrhea, or urinary changes.  Denies leg pain or leg swelling.  No history of blood clots.  Patient was worried about hide was feeling and came in for evaluation.  He reports symptoms have since improved but he still having the residual cough.  No further shortness of breath reported.  No other fevers or chills.  On exam, lungs had some very faint rhonchi but otherwise were clear.  No wheezing.  No rales.  Chest nontender.  No murmur.  Abdomen nontender.  Good pulses in extremities.  Patient resting comfortably now.  He reports his palpitations and anxiety has since improved but he is a little worried about his cough.  Patient had EKG that showed no STEMI.  Sinus rhythm.  Clinically I do suspect the patient's caffeine intake caused this panic attack as it has in the past for him however with his recent cough and the shortness of breath patient had workup to rule out other concerning etiologies.  X-ray shows evidence of pneumonia.  Troponin negative.  D-dimer was negative, doubt PE.  Rest of workup overall reassuring.  Blood pressure has improved since he has been here and he appears very comfortable.  No hypoxia at all.  We had a shared decision conversation agree with prescription for antibiotics and he will follow-up with his primary doctor.  He will also follow-up with him and keep an eye on his blood pressure.  Patient agreed with plan of care and have the questions or concerns.  Patient discharged in good condition after otherwise reassuring workup aside from the pneumonia.             Final Clinical Impression(s) / ED Diagnoses Final diagnoses:  Pneumonia of lower lobe due to infectious organism, unspecified laterality  Acute cough  Palpitations  Shortness of  breath    Rx / DC Orders ED Discharge Orders  Ordered    doxycycline (VIBRAMYCIN) 100 MG capsule  2 times daily        03/13/23 1632           Clinical Impression: 1. Pneumonia of lower lobe due to infectious organism, unspecified laterality   2. Acute cough   3. Palpitations   4. Shortness of breath     Disposition: Discharge  Condition: Good  I have discussed the results, Dx and Tx plan with the pt(& family if present). He/she/they expressed understanding and agree(s) with the plan. Discharge instructions discussed at great length. Strict return precautions discussed and pt &/or family have verbalized understanding of the instructions. No further questions at time of discharge.    Discharge Medication List as of 03/13/2023  4:33 PM     START taking these medications   Details  doxycycline (VIBRAMYCIN) 100 MG capsule Take 1 capsule (100 mg total) by mouth 2 (two) times daily for 5 days., Starting Sun 03/13/2023, Until Fri 03/18/2023, Print        Follow Up: Deatra James, MD [address] [place] [address] [phone]     [facility] Emergency Department at [facility] [address] [id number] mc [place] [address] [phone]       Clester Chlebowski, Canary Brim, MD 03/13/23 1704

## 2023-03-13 NOTE — ED Triage Notes (Signed)
Pt arrived via POV, c/o sudden onset of SOB, generalized weakness, dizziness.   CBG 123

## 2023-03-13 NOTE — ED Notes (Signed)
Called lab to add on Troponin. Verified they can run off blood collected earlier.

## 2023-03-13 NOTE — Discharge Instructions (Signed)
Your history, exam, and workup today are consistent with pneumonia causing your cough and contribute to your shortness of breath.  I do suspect the palpitations were due to the amount of caffeine you had this afternoon and likely led to a mild panic attack.  The rest of your workup was reassuring and you are not septic.  Your symptoms have improved and your vital signs are reassuring.  We feel you are safe for discharge home but do want you to follow-up with your primary doctor.  Please take the prescription for antibiotics and rest and stay hydrated.  If any symptoms change or worsen acutely, please return to the nearest emergency department.

## 2023-03-14 NOTE — Telephone Encounter (Signed)
Called and spoke with pt letting him know the recommendations per Dr. Val Eagle and he verbalized understanding.  Pt said his breathing had gradually become worse to the point that he went to [facility] ED yesterday 6/2. Pt said they diagnosed him with pna and prescribed doxycycline abx.   Pt wanted to have the xray reviewed by Dr. Val Eagle just to make sure they were correct with the diagnosis and if so, what Dr. Val Eagle recommended based off of this.  Dr. Val Eagle, please advise.

## 2023-04-02 ENCOUNTER — Telehealth: Payer: Self-pay | Admitting: Pulmonary Disease

## 2023-04-02 ENCOUNTER — Ambulatory Visit (INDEPENDENT_AMBULATORY_CARE_PROVIDER_SITE_OTHER): Payer: BC Managed Care – PPO

## 2023-04-02 DIAGNOSIS — G4733 Obstructive sleep apnea (adult) (pediatric): Secondary | ICD-10-CM

## 2023-04-02 NOTE — Telephone Encounter (Signed)
Call patient  Sleep study result  Date of study: 03/10/2023  Impression: Moderate obstructive sleep apnea with mild oxygen desaturations  Recommendation: DME referral  Recommend CPAP therapy for moderate obstructive sleep apnea  Auto titrating CPAP with pressure settings of 5-20 will be appropriate  Encourage weight loss measures  Follow-up in the office 4 to 6 weeks following initiation of treatment

## 2023-04-04 ENCOUNTER — Telehealth: Payer: Self-pay | Admitting: Pulmonary Disease

## 2023-04-04 DIAGNOSIS — G4733 Obstructive sleep apnea (adult) (pediatric): Secondary | ICD-10-CM

## 2023-04-04 NOTE — Telephone Encounter (Signed)
PT ret call he said, to arrange HS results. Pls call @ [phone]   I read the results to him. He has 2 Cpap machines from his past and needs guidance on where to take it to be set up or does he meet the requirements of a new machine.

## 2023-04-04 NOTE — Telephone Encounter (Signed)
Pt called back states he needs order for CPAP nasal pillows

## 2023-04-04 NOTE — Telephone Encounter (Signed)
Pt will get call from [employer] for troubleshooting, needs new order for CPAP supplies if [employer] says any are needed.   Will call pt and update him with this info   Nothing else needed at the time

## 2023-04-04 NOTE — Telephone Encounter (Signed)
ATC x1 LVM for patient to call our office back. 

## 2023-04-05 NOTE — Telephone Encounter (Signed)
Okay to place order for nasal pillows for CPAP

## 2023-04-07 NOTE — Telephone Encounter (Signed)
Referral for nasal pillows sent  Nothing further needed

## 2023-04-29 NOTE — Telephone Encounter (Signed)
Sleep study result's have been sent to mychart . Order has been placed for CPAP . Nothing else further needed.

## 2023-04-29 NOTE — Telephone Encounter (Signed)
ATC x1 LVM for patient to call our office back regarding sleep study and order for Nasal pillow and if patient is on a CPAP machine .

## 2023-06-03 ENCOUNTER — Other Ambulatory Visit: Payer: Self-pay | Admitting: Pulmonary Disease

## 2023-12-10 ENCOUNTER — Other Ambulatory Visit: Payer: Self-pay | Admitting: Pulmonary Disease

## 2023-12-12 NOTE — Telephone Encounter (Signed)
Needs office visit for further refills

## 2024-03-19 ENCOUNTER — Emergency Department (HOSPITAL_COMMUNITY)
Admission: EM | Admit: 2024-03-19 | Discharge: 2024-03-19 | Disposition: A | Discharge status: Home / Self Care | Attending: Emergency Medicine | Admitting: Emergency Medicine

## 2024-03-19 ENCOUNTER — Emergency Department (HOSPITAL_COMMUNITY)

## 2024-03-19 DIAGNOSIS — R079 Chest pain, unspecified: Secondary | ICD-10-CM | POA: Diagnosis not present

## 2024-03-19 DIAGNOSIS — Z7982 Long term (current) use of aspirin: Secondary | ICD-10-CM | POA: Insufficient documentation

## 2024-03-19 DIAGNOSIS — I10 Essential (primary) hypertension: Secondary | ICD-10-CM | POA: Insufficient documentation

## 2024-03-19 DIAGNOSIS — Z79899 Other long term (current) drug therapy: Secondary | ICD-10-CM | POA: Insufficient documentation

## 2024-03-19 DIAGNOSIS — Z720 Tobacco use: Secondary | ICD-10-CM | POA: Insufficient documentation

## 2024-03-19 LAB — TROPONIN I (HIGH SENSITIVITY)
Troponin I (High Sensitivity): 4 ng/L (ref <18)
Troponin I (High Sensitivity): 5 ng/L (ref <18)

## 2024-03-19 LAB — BASIC METABOLIC PANEL WITH GFR
Anion gap: 12 (ref 5–15)
BUN: 16 mg/dL (ref 8–23)
CO2: 23 mmol/L (ref 22–32)
Calcium: 9.3 mg/dL (ref 8.9–10.3)
Chloride: 106 mmol/L (ref 98–111)
Creatinine, Ser: 1.21 mg/dL (ref 0.61–1.24)
GFR, Estimated: >60 mL/min (ref >60)
Glucose, Bld: 88 mg/dL (ref 70–99)
Potassium: 4 mmol/L (ref 3.5–5.1)
Sodium: 141 mmol/L (ref 135–145)

## 2024-03-19 LAB — CBC
HCT: 52.1 % — ABNORMAL HIGH (ref 39.0–52.0)
Hemoglobin: 16.7 g/dL (ref 13.0–17.0)
MCH: 28.6 pg (ref 26.0–34.0)
MCHC: 32.1 g/dL (ref 30.0–36.0)
MCV: 89.2 fL (ref 80.0–100.0)
Platelets: 184 10*3/uL (ref 150–400)
RBC: 5.84 MIL/uL — ABNORMAL HIGH (ref 4.22–5.81)
RDW: 15.3 % (ref 11.5–15.5)
WBC: 8.5 10*3/uL (ref 4.0–10.5)
nRBC: 0 % (ref 0.0–0.2)

## 2024-03-19 MED ORDER — NITROGLYCERIN 0.4 MG SL SUBL: 0.4000 mg | SUBLINGUAL_TABLET | SUBLINGUAL | 0 refills | Status: AC | PRN

## 2024-03-19 NOTE — ED Triage Notes (Signed)
 Patient states he has had a chest pain on and off since Saturday. Patient states he was getting haircut on Saturday and started having chest pain but finally subsided, then states he woke up in middle of sleeping last night with back pain and left shoulder that lasted about one hour. Patient went to [facility] today and was told to come to ED due to abnormal EKG. Patient denies any chest pain, N/V at this time.

## 2024-03-19 NOTE — ED Provider Notes (Signed)
 [place] EMERGENCY DEPARTMENT AT [facility] Provider Note   CSN: [id number] Arrival date & time: 03/19/24  1845     History  No chief complaint on file.   Michael Berger is a 62 y.o. male who presents emergency department with a chief complaint of chest pain.  Patient reports that he had onset of chest pain yesterday while he was getting his haircut.  He was going to check out when he had onset of pressure and pain in his chest that was so significant he had to go sit in his car.  He states that it lasted approximately 20 minutes before it resolved and he was able to come back in and pay his bill.  He states that he was feeling fine when he got home but he awoke in the middle the night with intense pain in his back and shoulder blade on the left side.  He states he again had pressure.  He states he was raising his arms up and walking trying to get the intense pain to go away.  He has never had pain like this before.  It was not specifically worsened by ambulation.  He denies diaphoresis shortness of breath nausea or vomiting.  He has a history of hypertension  HPI     Home Medications Prior to Admission medications   Medication Sig Start Date End Date Taking? Authorizing Provider  aspirin  EC 81 MG tablet Take 1 tablet (81 mg total) by mouth daily. 02/07/20   Knox Perl, MD  benzonatate  (TESSALON ) 100 MG capsule Take 1 capsule (100 mg total) by mouth every 8 (eight) hours as needed for cough. Patient not taking: Reported on 02/23/2023 10/15/21   Dodson Freestone, FNP  cyclobenzaprine  (FLEXERIL ) 5 MG tablet Take 1-2 tablets (5-10 mg total) by mouth 2 (two) times daily as needed for up to 20 doses for muscle spasms. 12/15/20   Buena Carmine, NP  hydrOXYzine  (ATARAX /VISTARIL ) 25 MG tablet Take 1 tablet (25 mg total) by mouth every 6 (six) hours. 04/07/21   Harden Leyden, NP  ibuprofen  (ADVIL ) 800 MG tablet Take 1 tablet (800 mg total) by mouth 3 (three) times daily. 02/05/22    Adolph Hoop, PA-C  L-Arginine POWD Take by mouth. Pre workout drink    [provider]  losartan  (COZAAR ) 100 MG tablet TAKE 1 TABLET (100 MG TOTAL) BY MOUTH DAILY. DISCONTINUE LOSARTAN  HCT 01/12/21 07/11/21  Knox Perl, MD  metoprolol  tartrate (LOPRESSOR ) 25 MG tablet Take 1 tablet (25 mg total) by mouth 2 (two) times daily. 09/10/19 02/07/20  Druscilla Gerhard, NP  pantoprazole  (PROTONIX ) 40 MG tablet Take 1 tablet (40 mg total) by mouth 2 (two) times daily. Patient taking differently: Take 40 mg by mouth as needed. 02/04/19   Alexander, Natalie, DO  pantoprazole  (PROTONIX ) 40 MG tablet TAKE 1 TABLET BY MOUTH EVERY DAY 12/12/23   Margaretann Sharper, MD  promethazine -dextromethorphan (PROMETHAZINE -DM) 6.25-15 MG/5ML syrup Take 5 mLs by mouth 4 (four) times daily as needed for cough. Patient not taking: Reported on 02/23/2023 02/24/21   Graham, Laura E, PA-C  rosuvastatin  (CRESTOR ) 10 MG tablet TAKE 1 TABLET BY MOUTH EVERY DAY 11/22/19   Druscilla Gerhard, NP  tadalafil  (CIALIS ) 5 MG tablet Take 1 tablet by mouth once daily 02/03/19   Cranston Dk, MD  tiZANidine  (ZANAFLEX ) 4 MG tablet Take 1 tablet (4 mg total) by mouth at bedtime. 02/05/22   Adolph Hoop, PA-C      Allergies  Apple (diagnostic) and Other    Review of Systems   Review of Systems  Physical Exam Updated Vital Signs BP (!) 160/89 (BP Location: Left Arm)   Pulse 71   Temp 97.9 F (36.6 C) (Oral)   Resp 18   Ht 6' (1.829 m)   Wt 104.5 kg   SpO2 100%   BMI 31.25 kg/m  Physical Exam Vitals and nursing note reviewed.  Constitutional:      General: He is not in acute distress.    Appearance: He is well-developed. He is not diaphoretic.  HENT:     Head: Normocephalic and atraumatic.  Eyes:     General: No scleral icterus.    Conjunctiva/sclera: Conjunctivae normal.  Cardiovascular:     Rate and Rhythm: Normal rate and regular rhythm.     Heart sounds: Normal heart sounds.  Pulmonary:     Effort: Pulmonary  effort is normal. No respiratory distress.     Breath sounds: Normal breath sounds.  Abdominal:     Palpations: Abdomen is soft.     Tenderness: There is no abdominal tenderness.  Musculoskeletal:     Cervical back: Normal range of motion and neck supple.  Skin:    General: Skin is warm and dry.  Neurological:     Mental Status: He is alert.  Psychiatric:        Behavior: Behavior normal.     ED Results / Procedures / Treatments   Labs (all labs ordered are listed, but only abnormal results are displayed) Labs Reviewed  BASIC METABOLIC PANEL WITH GFR  CBC  TROPONIN I (HIGH SENSITIVITY)    EKG EKG Interpretation Date/Time:  Monday March 19 2024 19:02:32 EDT Ventricular Rate:  68 PR Interval:  197 QRS Duration:  79 QT Interval:  380 QTC Calculation: 405 R Axis:   52  Text Interpretation: Sinus rhythm Low voltage, precordial leads No significant change since last tracing Confirmed by Scarlette Currier ([id number]) on 03/19/2024 7:08:05 PM  Radiology DG Chest Port 1 View Result Date: 03/19/2024 CLINICAL DATA:  Chest pain for several days, initial encounter EXAM: PORTABLE CHEST 1 VIEW COMPARISON:  03/13/2023 FINDINGS: Cardiac shadow is stable. The lungs are well aerated bilaterally. No focal infiltrate or sizable effusion is seen. No bony abnormality is noted. IMPRESSION: No active disease. Electronically Signed   By: Violeta Grey M.D.   On: 03/19/2024 19:54    Procedures Procedures    Medications Ordered in ED Medications - No data to display  ED Course/ Medical Decision Making/ A&P                                 Medical Decision Making This patient presents to the ED for concern of chest pain , this involves an extensive number of treatment options, and is a complaint that carries with it a high risk of complications and morbidity.  The emergent differential diagnosis of chest pain includes: Acute coronary syndrome, pericarditis, aortic dissection, pulmonary embolism,  tension pneumothorax, pneumonia, and esophageal rupture.    Co morbidities:       has a past medical history of Anxiety, BPH with obstruction/lower urinary tract symptoms, Closed fracture of left lateral malleolus, GERD (gastroesophageal reflux disease), Hypertension, Sleep apnea, and Stress.   Social Determinants of Health:        SDOH Screenings Depression (PHQ2-9): Low Risk  (12/23/2018) Tobacco Use: Low Risk  (03/13/2023)   Additional history:  {  Additional history obtained from EMR {Records reviewed including previous   Lab Tests:  I Ordered, and personally interpreted labs.  The pertinent results include:   Labs reviewed- no evidence of abnormality   Imaging Studies:  I ordered imaging studies including cxr I independently visualized and interpreted imaging which showed no acute findingd I agree with the radiologist interpretation  Cardiac Monitoring/ECG:       The patient was maintained on a cardiac monitor.  I personally viewed and interpreted the cardiac monitored which showed an underlying rhythm of:  NSR rate of 68  Medicines ordered and prescription drug management:   Test Considered:       I considered ct chest angiogram to r/o dissection- very low suspicion  Critical Interventions:         Consultations Obtained:   Problem List / ED Course:       (R07.9) Chest pain, unspecified type  (primary encounter diagnosis) Plan: Ambulatory referral to Cardiology   ION:GEXBM the large differential diagnosis for Arlie Lain, the decision making in this case is of high complexity.  After evaluating all of the data points in this case, the presentation of Rilley Stash is NOT consistent with Acute Coronary Syndrome (ACS) and/or myocardial ischemia, pulmonary embolism, aortic dissection; Borhaave's, significant arrythmia, pneumothorax, cardiac tamponade, or other emergent cardiopulmonary condition.  Further, the presentation of Filippo Puls is NOT  consistent with pericarditis, myocarditis, cholecystitis, pancreatitis, mediastinitis, endocarditis, new valvular disease.  Additionally, the presentation of [facility] NOT consistent with flail chest, cardiac contusion, ARDS, or significant intra-thoracic or intra-abdominal bleeding.  Moreover, this presentation is NOT consistent with pneumonia, sepsis, or pyelonephritis.  Will d/c with NTG for cp- STRICT INSTRUCTIONS NOT TO TAKE THIS MEDICATION WITHIN 24 HOURS OF TAKING CIALIS  AND VICE VERSA   Strict return and follow-up precautions have been given by me personally or by detailed written instruction given verbally by nursing staff using the teach back method to the patient/family/caregiver(s).  Data Reviewed/Counseling: I have reviewed the patient's vital signs, nursing notes, and other relevant tests/information. I had a detailed discussion regarding the historical points, exam findings, and any diagnostic results supporting the discharge diagnosis. I also discussed the need for outpatient follow-up and the need to return to the ED if symptoms worsen or if there are any questions or concerns that arise at home.     Dispostion:  After consideration of the diagnostic results and the patients response to treatment, I feel that the patent would benefit from close cardiac follow up..    Amount and/or Complexity of Data Reviewed Labs: ordered. Radiology: ordered.  Risk Prescription drug management.           Final Clinical Impression(s) / ED Diagnoses Final diagnoses:  None    Rx / DC Orders ED Discharge Orders     None         Tama Fails, PA-C 03/20/24 0019    Scarlette Currier, MD 03/23/24 [phone]

## 2024-03-19 NOTE — Discharge Instructions (Addendum)
 I am discharging you with nitroglycerine. IF YOU TAKE CIALIS  DO NOT TAKE THE NITROGLYCERINE IN A 24 HOUR PERIOD. CONVERSELY IF YOU TAKE NITROGLYCERINE DO NOT TAKE THE CIALIS  WITH IN 24 HOURS AS THIS CAN CAUSE SEVERE DROP BLOOD PRESSURE.  I have sent an urgent message to Dr. Berry Bristol and his staff to see you in the clinic asap. Your caregiver has diagnosed you as having chest pain that is not specific for one problem, but does not require admission.  You are at low risk for an acute heart condition or other serious illness. Chest pain comes from many different causes.  SEEK IMMEDIATE MEDICAL ATTENTION IF: You have severe chest pain, especially if the pain is crushing or pressure-like and spreads to the arms, back, neck, or jaw, or if you have sweating, nausea (feeling sick to your stomach), or shortness of breath. THIS IS AN EMERGENCY. Don't wait to see if the pain will go away. Get medical help at once. Call 911 or 0 (operator). DO NOT drive yourself to the hospital.  Your chest pain gets worse and does not go away with rest.  You have an attack of chest pain lasting longer than usual, despite rest and treatment with the medications your caregiver has prescribed.  You wake from sleep with chest pain or shortness of breath.  You feel dizzy or faint.  You have chest pain not typical of your usual pain for which you originally saw your caregiver.

## 2024-03-19 NOTE — ED Notes (Signed)
 Pt stated they normally use ultrasound to find his vein

## 2024-03-22 ENCOUNTER — Encounter: Payer: Self-pay | Admitting: Cardiology

## 2024-03-22 ENCOUNTER — Ambulatory Visit: Attending: Cardiology | Admitting: Cardiology

## 2024-03-22 VITALS — BP 134/74 | HR 62 | Ht 72.0 in | Wt 228.0 lb

## 2024-03-22 DIAGNOSIS — R0789 Other chest pain: Secondary | ICD-10-CM

## 2024-03-22 DIAGNOSIS — R7303 Prediabetes: Secondary | ICD-10-CM

## 2024-03-22 DIAGNOSIS — E7800 Pure hypercholesterolemia, unspecified: Secondary | ICD-10-CM

## 2024-03-22 DIAGNOSIS — I10 Essential (primary) hypertension: Secondary | ICD-10-CM | POA: Diagnosis not present

## 2024-03-22 NOTE — Progress Notes (Signed)
 Cardiology Office Note:  .   Date:  03/22/2024  ID:  Michael Berger, DOB 1961-07-23, MRN [id number] PCP: Sun, Vyvyan, MD  [facility] Providers Cardiologist:  Knox Perl, MD   History of Present Illness: .   Michael Berger is a 63 y.o.  African-American male with hypertension with hypertensive heart disease, hypercholesterolemia, OSA on CPAP presented to the emergency room on 03/19/2024 with chest discomfort and referral made to me for further evaluation.  I had seen him in 2021.  He presented with chest pain suggestive of angina but with treatment with beta-blocker therapy and hypertension control, he had resolution of symptoms and nuclear stress test in 2020 was nonischemic with EF 67% and echocardiogram at that time was normal with mild MR.  Event monitor revealed occasional PACs and PVCs.  He is retired Astronomer, he is now working as a [employer].   Discussed the use of AI scribe software for clinical note transcription with the patient, who gave verbal consent to proceed.  History of Present Illness   Michael Berger is a 63 year old male who presents with chest pressure and shoulder blade pain. He was referred by [facility] for further evaluation after an EKG prompted concern.  On Friday, he experienced overwhelming chest pressure at the barbershop, which lasted about ten minutes and subsided after sitting down. Early Saturday morning, he was awakened by severe pressure in his shoulder blade radiating downwards, lasting over an hour. Walking around the house for two hours did not relieve the pain, but it resolved by the time he reached the hospital. During these episodes, deep breaths felt tight, and raising his arms provided some relief. No recurrence of pain since these episodes.  Approximately a month ago, he experienced intermittent lightheadedness, which resolved spontaneously.  He is taking losartan  100 mg daily, metoprolol  tartrate 25 mg twice daily, and rosuvastatin   10 mg daily. He has nitroglycerin but has not used it. No nausea, vomiting, or sweating associated with the chest and shoulder pain.       Labs   Lab Results  Component Value Date   NA 141 03/19/2024   K 4.0 03/19/2024   CO2 23 03/19/2024   GLUCOSE 88 03/19/2024   BUN 16 03/19/2024   CREATININE 1.21 03/19/2024   CALCIUM  9.3 03/19/2024   GFRNONAA >60 03/19/2024      Latest Ref Rng & Units 03/19/2024    7:25 PM 03/13/2023    1:30 PM 01/03/2020    2:37 PM  BMP  Glucose 70 - 99 mg/dL 88  409  84   BUN 8 - 23 mg/dL 16  17  20    Creatinine 0.61 - 1.24 mg/dL 8.11  9.14  7.82   BUN/Creat Ratio 9 - 20   16   Sodium 135 - 145 mmol/L 141  137  143   Potassium 3.5 - 5.1 mmol/L 4.0  3.2  4.4   Chloride 98 - 111 mmol/L 106  104  103   CO2 22 - 32 mmol/L 23  20  24    Calcium  8.9 - 10.3 mg/dL 9.3  8.8  9.7       Latest Ref Rng & Units 03/19/2024    7:25 PM 03/13/2023    1:30 PM 02/08/2020    2:55 PM  CBC  WBC 4.0 - 10.5 K/uL 8.5  6.9  8.9   Hemoglobin 13.0 - 17.0 g/dL 95.6  21.3  08.6   Hematocrit 39.0 - 52.0 % 52.1  48.8  50.5   Platelets 150 - 400 K/uL 184  199  209     Lab Results  Component Value Date   TSH 0.968 03/13/2023    External Labs:  Care everywhere labs 10/20/2023:  Total cholesterol 141, triglycerides 113, HDL 42, LDL 58.  A1c 6.0%.  ROS  Review of Systems  Cardiovascular:  Negative for chest pain, dyspnea on exertion and leg swelling.    Physical Exam:   VS:  BP 134/74   Pulse 62   Ht 6' (1.829 m)   Wt 228 lb (103.4 kg)   SpO2 97%   BMI 30.92 kg/m    Wt Readings from Last 3 Encounters:  03/22/24 228 lb (103.4 kg)  03/19/24 230 lb 6.4 oz (104.5 kg)  02/23/23 232 lb (105.2 kg)    Physical Exam Constitutional:      Appearance: He is obese.  Neck:     Vascular: No carotid bruit or JVD.   Cardiovascular:     Rate and Rhythm: Normal rate and regular rhythm.     Pulses: Intact distal pulses.     Heart sounds: Normal heart sounds. No murmur heard.    No  gallop.  Pulmonary:     Effort: Pulmonary effort is normal.     Breath sounds: Normal breath sounds.  Abdominal:     General: Bowel sounds are normal.     Palpations: Abdomen is soft.   Musculoskeletal:     Right lower leg: No edema.     Left lower leg: No edema.    Studies Reviewed: .    Echo and stress  EKG:      EKG 03/19/2024 done in the emergency room personally reviewed: Normal sinus rhythm at the rate of 68 bpm.  Low-voltage complexes.  Compared to 03/13/2023, no change.  Medications and allergies    Allergies  Allergen Reactions   Apple (Diagnostic) Rash   Other Rash    apples     Current Outpatient Medications:    aspirin  EC 81 MG tablet, Take 1 tablet (81 mg total) by mouth daily., Disp: 90 tablet, Rfl: 3   L-Arginine POWD, Take by mouth. Pre workout drink, Disp: , Rfl:    losartan  (COZAAR ) 100 MG tablet, TAKE 1 TABLET (100 MG TOTAL) BY MOUTH DAILY. DISCONTINUE LOSARTAN  HCT, Disp: 90 tablet, Rfl: 0   metoprolol  tartrate (LOPRESSOR ) 25 MG tablet, Take 1 tablet (25 mg total) by mouth 2 (two) times daily., Disp: 180 tablet, Rfl: 2   nitroGLYCERIN (NITROSTAT) 0.4 MG SL tablet, Place 1 tablet (0.4 mg total) under the tongue every 5 (five) minutes as needed for chest pain (up to 3 doses)., Disp: 30 tablet, Rfl: 0   pantoprazole  (PROTONIX ) 40 MG tablet, Take 1 tablet (40 mg total) by mouth 2 (two) times daily., Disp: 180 tablet, Rfl: 1   rosuvastatin  (CRESTOR ) 10 MG tablet, TAKE 1 TABLET BY MOUTH EVERY DAY, Disp: 90 tablet, Rfl: 1   tadalafil  (CIALIS ) 5 MG tablet, Take 1 tablet by mouth once daily, Disp: 30 tablet, Rfl: 0   ASSESSMENT AND PLAN: .      ICD-10-CM   1. Chest pain, musculoskeletal  R07.89     2. Primary hypertension  I10     3. Pure hypercholesterolemia  E78.00     4. Prediabetes  R73.03      Assessment and Plan    Muscle spasm in chest wall Intermittent chest pressure and tightness on Friday and Saturday, relieved by raising arms,  suggestive of  musculoskeletal origin rather than cardiac. Absence of nausea, vomiting, or sweating, typical of cardiac pain. Normal EKG and previous stress test indicate low likelihood of cardiac etiology. Symptoms likely due to muscle spasm in the chest wall and shoulder blade area. - Advise taking extra strength Tylenol  for discomfort as needed - Reassure that no further cardiac testing is necessary at this time  Neck pain Chronic neck pain affecting exercise ability. Currently undergoing therapy. Discussed alternative exercises that minimize neck strain, such as swimming and stationary biking. - Recommend swimming or stationary biking to minimize neck strain during exercise  Prediabetes A1c is 6.0, indicating prediabetes. Emphasized the importance of weight loss and exercise to prevent progression to diabetes. Discussed benefits of exercises like swimming, stationary biking, and using an elliptical, which are less likely to exacerbate neck pain. - Encourage weight loss and regular exercise, such as swimming, stationary biking, or using an elliptical  Hypertension Blood pressure is well controlled on current medication regimen with losartan  100 mg once daily and metoprolol  tartrate 25 mg twice daily. - Continue losartan  100 mg once daily - Continue metoprolol  tartrate 25 mg twice daily  Hyperlipidemia Cholesterol levels are excellent on current regimen. LDL is 58 on rosuvastatin  10 mg daily. - Continue rosuvastatin  10 mg once daily      See me back PRN Signed,  Knox Perl, MD, [facility] 03/22/2024, 6:56 PM [facility] [address] [place], [place] [address] Phone: [phone]. Fax:  [phone]

## 2024-03-22 NOTE — Patient Instructions (Signed)

## 2024-10-13 ENCOUNTER — Ambulatory Visit: Admission: EM | Admit: 2024-10-13 | Discharge: 2024-10-13 | Disposition: A | Discharge status: Home / Self Care

## 2024-10-13 DIAGNOSIS — L2089 Other atopic dermatitis: Secondary | ICD-10-CM

## 2024-10-13 MED ORDER — TRIAMCINOLONE ACETONIDE 0.1 % EX CREA: 1.0000 | TOPICAL_CREAM | Freq: Two times a day (BID) | CUTANEOUS | 0 refills | Status: AC

## 2024-10-13 NOTE — ED Provider Notes (Signed)
 " [name]    CSN: [id number] Arrival date & time: 10/13/24  9175      History   Chief Complaint No chief complaint on file.   HPI Michael Berger is a 64 y.o. male.   Mr. Iten presents today with complaint of rash around his neck that began 2 weeks ago and has been since onset.  He reports that the rash is itching and burning and the skin is dry, flaky, and dark.  He reports that began on the posterior aspect of his neck and has extended circumferentially to the anterior aspects.  He also reports a similar rash in his right antecubital space.  Reports that prior to symptom onset he was wearing a necklace that caused some irritation, but rash is not an area of neck that was in contact with necklace.  Reports history of a dermatological condition, which he believes was psoriasis.  For this his dermatologist previously slightly prescribed him a retinol cream which he applied to the area.  This caused great discomfort.  He has since been applying Neosporin and hydrocortisone cream which provides temporary relief  The history is provided by the patient.    Past Medical History:  Diagnosis Date   Anxiety    past hx of situational anxiety    BPH with obstruction/lower urinary tract symptoms    Closed fracture of left lateral malleolus    GERD (gastroesophageal reflux disease)    Hypertension    Sleep apnea    does not wear CPAP   Stress     Patient Active Problem List   Diagnosis Date Noted   History of Helicobacter pylori infection 12/16/2018   Cough 12/16/2018   Closed fracture of left lateral malleolus 11/21/2017   Intractable vomiting with nausea 10/05/2016   GERD (gastroesophageal reflux disease) 12/02/2015   OSA on CPAP 12/02/2015   DDD (degenerative disc disease), cervical 12/02/2015   DDD (degenerative disc disease), lumbosacral 12/02/2015   Essential hypertension 04/28/2014   Erectile dysfunction 04/26/2014   Obesity, unspecified 07/06/2013    Past  Surgical History:  Procedure Laterality Date   APPENDECTOMY     Age 31   COLONOSCOPY     ORIF ANKLE FRACTURE Left 11/23/2017   Procedure: OPEN REDUCTION INTERNAL FIXATION (ORIF) LEFT LATERAL MALLEOLUS;  Surgeon: Jerri Kay HERO, MD;  Location: [facility];  Service: Orthopedics;  Laterality: Left;   POLYPECTOMY         Home Medications    Prior to Admission medications  Medication Sig Start Date End Date Taking? Authorizing Provider  albuterol  (VENTOLIN  HFA) 108 (90 Base) MCG/ACT inhaler SMARTSIG:1 Puff(s) Via Inhaler Every 4-6 Hours PRN 08/20/24  Yes [provider]  tretinoin (RETIN-A) 0.025 % cream SMARTSIG:Topical Every Evening 09/19/24  Yes [provider]  triamcinolone  cream (KENALOG ) 0.1 % Apply 1 Application topically 2 (two) times daily. 10/13/24  Yes Leatrice Vernell HERO, NP  aspirin  EC 81 MG tablet Take 1 tablet (81 mg total) by mouth daily. 02/07/20   Ladona Heinz, MD  L-Arginine POWD Take by mouth. Pre workout drink    [provider]  losartan  (COZAAR ) 100 MG tablet TAKE 1 TABLET (100 MG TOTAL) BY MOUTH DAILY. DISCONTINUE LOSARTAN  HCT 01/12/21 03/22/24  Ladona Heinz, MD  metoprolol  tartrate (LOPRESSOR ) 25 MG tablet Take 1 tablet (25 mg total) by mouth 2 (two) times daily. 09/10/19 03/22/24  Charleen Emmalene Nightingale, NP  nitroGLYCERIN  (NITROSTAT ) 0.4 MG SL tablet Place 1 tablet (0.4 mg total) under  the tongue every 5 (five) minutes as needed for chest pain (up to 3 doses). 03/19/24   Harris, Abigail, PA-C  pantoprazole  (PROTONIX ) 40 MG tablet Take 1 tablet (40 mg total) by mouth 2 (two) times daily. 02/04/19   Alexander, Natalie, DO  rosuvastatin  (CRESTOR ) 10 MG tablet TAKE 1 TABLET BY MOUTH EVERY DAY 11/22/19   Charleen Emmalene Nightingale, NP  tadalafil  (CIALIS ) 5 MG tablet Take 1 tablet by mouth once daily 02/03/19   Loreli Elyn SAILOR, MD    Family History Family History  Problem Relation Age of Onset   Hypertension Mother    Colon cancer Neg Hx    Esophageal  cancer Neg Hx    Stomach cancer Neg Hx    Rectal cancer Neg Hx    Colon polyps Neg Hx     Social History Social History[1]   Allergies   Apple (diagnostic) and Other   Review of Systems Review of Systems  Constitutional: Negative.   Skin:  Positive for rash. Negative for wound.     Physical Exam Triage Vital Signs ED Triage Vitals  Encounter Vitals Group     BP 10/13/24 1002 (!) 143/95     Girls Systolic BP Percentile --      Girls Diastolic BP Percentile --      Boys Systolic BP Percentile --      Boys Diastolic BP Percentile --      Pulse Rate 10/13/24 1002 78     Resp 10/13/24 1002 18     Temp 10/13/24 1002 98.7 F (37.1 C)     Temp Source 10/13/24 1002 Oral     SpO2 10/13/24 1002 99 %     Weight 10/13/24 0959 234 lb (106.1 kg)     Height 10/13/24 0959 6' (1.829 m)     Head Circumference --      Peak Flow --      Pain Score 10/13/24 0959 3     Pain Loc --      Pain Education --      Exclude from Growth Chart --    No data found.  Updated Vital Signs BP (!) 143/95 (BP Location: Left Arm) Comment: did not take bp medicine  Pulse 78   Temp 98.7 F (37.1 C) (Oral)   Resp 18   Ht 6' (1.829 m)   Wt 234 lb (106.1 kg)   SpO2 99%   BMI 31.74 kg/m   Visual Acuity Right Eye Distance:   Left Eye Distance:   Bilateral Distance:    Right Eye Near:   Left Eye Near:    Bilateral Near:     Physical Exam Vitals and nursing note reviewed.  Constitutional:      General: He is not in acute distress.    Appearance: Normal appearance. He is normal weight. He is not toxic-appearing.  Eyes:     Conjunctiva/sclera: Conjunctivae normal.  Cardiovascular:     Rate and Rhythm: Normal rate and regular rhythm.     Heart sounds: Normal heart sounds.  Pulmonary:     Effort: Pulmonary effort is normal.     Breath sounds: Normal breath sounds and air entry.  Musculoskeletal:     Lumbar back: Decreased range of motion: Guarded due to pain. Negative right straight leg  raise test and negative left straight leg raise test.  Skin:    General: Skin is warm and dry.     Findings: Rash present.     Comments: Hyperpigmented skin circumferentially  around neck.  Skin is dry, flaky, and cracked in places.  No palpable skin lesions noted.  Similar rash noted in right antecubital fossa  Neurological:     Mental Status: He is alert and oriented to person, place, and time.  Psychiatric:        Mood and Affect: Mood normal.        Behavior: Behavior normal.      UC Treatments / Results  Labs (all labs ordered are listed, but only abnormal results are displayed) Labs Reviewed - No data to display  EKG   Radiology No results found.  Procedures Procedures (including critical care time)  Medications Ordered in UC Medications - No data to display  Initial Impression / Assessment and Plan / UC Course  I have reviewed the triage vital signs and the nursing notes.  Pertinent labs & imaging results that were available during my care of the patient were reviewed by me and considered in my medical decision making (see chart for details).     1.  Atopic dermatitis vs contact dermatitis from necklace Triamcinolone  cream-thin layer to affected areas twice daily until resolved.  Advise discontinuation of retinol cream, hydrocortisone cream and, Neosporin.  Advised avoiding wearing necklace as well as shorts with tight fitting collars to avoid irritation.  Final Clinical Impressions(s) / UC Diagnoses   Final diagnoses:  Flexural atopic dermatitis     Discharge Instructions      Atopic Dermatitis (Eczema) - Neck  What this is Atopic dermatitis is a chronic inflammatory skin condition that causes dry, itchy, irritated skin. Flares can occur with weather changes, sweating, friction, or exposure to irritants. The neck is a sensitive area and usually responds well to short courses of treatment.  Common symptoms - Red or darker patches of skin - Itching or  burning - Dry, flaky, or rough skin - Skin thickening with repeated scratching  Expected recovery - Skin typically improves within a few days to 1 week of treatment - Redness and itching should steadily decrease - Once clear, medication should be stopped to prevent skin thinning  Treatment Plan  Triamcinolone  cream -- apply twice daily until resolved - Why it works: Reduces skin inflammation, redness, and itching - How to use:   - Apply a thin layer to affected skin only   - Use twice daily until the rash clears   - Stop once resolved; do not use daily long-term - Common side effects: Skin thinning, lightening, or irritation with prolonged use - Avoid applying to broken skin or near the eyes  Symptom Management & Skin Care - Use a gentle, fragrance-free moisturizer at least twice daily - Apply moisturizer after bathing while skin is still slightly damp - Avoid hot showers; use lukewarm water - Avoid scented soaps, lotions, and detergents - Wear soft, breathable fabrics and avoid friction at the neck  When to follow up - No improvement after 7-10 days of treatment - Worsening redness, pain, or swelling - Signs of infection (oozing, crusting, warmth) - Frequent flares needing repeated steroid use  When to Go to the Emergency Department - Rapidly spreading rash with fever - Severe swelling or pain - Signs of a serious allergic reaction     ED Prescriptions     Medication Sig Dispense Auth. Provider   triamcinolone  cream (KENALOG ) 0.1 % Apply 1 Application topically 2 (two) times daily. 30 g Leatrice Vernell HERO, NP      PDMP not reviewed this encounter.    [  1]  Social History Tobacco Use   Smoking status: Never   Smokeless tobacco: Never   Tobacco comments:    cigars- once yearly  Vaping Use   Vaping status: Never Used  Substance Use Topics   Alcohol use: Yes    Alcohol/week: 0.0 standard drinks of alcohol    Comment: Rare; holidays   Drug use: No      Leatrice Vernell HERO, NP 10/13/24 1052  "

## 2024-10-13 NOTE — ED Triage Notes (Signed)
 Luan presents with rash around his neck. It started 2 weeks ago in the back of my neck and then spread. [place] was at the beach and had a breakout around my face, I went to see my dermatologist and he prescribed something that cleared it up. I used it on my neck and had burning. I used Neosporin and Hydrocortisone on it to keep it from being dry. He states he has not changed detergents, soaps or anything that may has caused it. I had a gold chain that was irritating the area, it did not fit in the area that the rash is in though. Describes area as itching, burning, discolored and uncomfortable.

## 2024-10-13 NOTE — Discharge Instructions (Signed)
 Atopic Dermatitis (Eczema) - Neck  What this is Atopic dermatitis is a chronic inflammatory skin condition that causes dry, itchy, irritated skin. Flares can occur with weather changes, sweating, friction, or exposure to irritants. The neck is a sensitive area and usually responds well to short courses of treatment.  Common symptoms - Red or darker patches of skin - Itching or burning - Dry, flaky, or rough skin - Skin thickening with repeated scratching  Expected recovery - Skin typically improves within a few days to 1 week of treatment - Redness and itching should steadily decrease - Once clear, medication should be stopped to prevent skin thinning  Treatment Plan  Triamcinolone  cream -- apply twice daily until resolved - Why it works: Reduces skin inflammation, redness, and itching - How to use:   - Apply a thin layer to affected skin only   - Use twice daily until the rash clears   - Stop once resolved; do not use daily long-term - Common side effects: Skin thinning, lightening, or irritation with prolonged use - Avoid applying to broken skin or near the eyes  Symptom Management & Skin Care - Use a gentle, fragrance-free moisturizer at least twice daily - Apply moisturizer after bathing while skin is still slightly damp - Avoid hot showers; use lukewarm water - Avoid scented soaps, lotions, and detergents - Wear soft, breathable fabrics and avoid friction at the neck  When to follow up - No improvement after 7-10 days of treatment - Worsening redness, pain, or swelling - Signs of infection (oozing, crusting, warmth) - Frequent flares needing repeated steroid use  When to Go to the Emergency Department - Rapidly spreading rash with fever - Severe swelling or pain - Signs of a serious allergic reaction

## 2024-11-14 ENCOUNTER — Ambulatory Visit (INDEPENDENT_AMBULATORY_CARE_PROVIDER_SITE_OTHER)

## 2024-11-14 ENCOUNTER — Ambulatory Visit
Admission: EM | Admit: 2024-11-14 | Discharge: 2024-11-14 | Disposition: A | Discharge status: Home / Self Care | Source: Home / Self Care

## 2024-11-14 ENCOUNTER — Encounter: Payer: Self-pay | Admitting: *Deleted

## 2024-11-14 DIAGNOSIS — R1084 Generalized abdominal pain: Secondary | ICD-10-CM | POA: Diagnosis not present

## 2024-11-14 DIAGNOSIS — K5900 Constipation, unspecified: Secondary | ICD-10-CM

## 2024-11-14 MED ORDER — POLYETHYLENE GLYCOL 3350 17 G PO PACK: 17.0000 g | PACK | Freq: Every day | ORAL | 0 refills | Status: AC

## 2024-11-14 NOTE — ED Provider Notes (Signed)
 " [facility]  Note:  This document was prepared using Dragon voice recognition software and may include unintentional dictation errors.  MRN: [id number] DOB: 1961-08-08  Subjective:   Michael Berger is a 64 y.o. male presenting for evaluation of the left lower side pain starting 2 days ago that is now moved to his abdomen.  Patient reports that a couple of days ago he ate some hamburger that he believes may have been spoiled which caused him to vomit.  The next day he ate some leftover turkey and began having pain in his abdomen.  Patient states he has not had a bowel movement in approximately 2 days.  Was thinking about taking a laxative but was afraid to take medication due to abdominal discomfort.  Patient reports that pain typically moves to different areas of the abdomen.  Denies any dysuria, increased urinary frequency, urinary discharge, nausea/vomiting, or flank pain.  Current Medications[1]   Allergies[2]  Past Medical History:  Diagnosis Date   Anxiety    past hx of situational anxiety    BPH with obstruction/lower urinary tract symptoms    Closed fracture of left lateral malleolus    GERD (gastroesophageal reflux disease)    Hypertension    Sleep apnea    does not wear CPAP   Stress      Past Surgical History:  Procedure Laterality Date   APPENDECTOMY     Age 46   COLONOSCOPY     ORIF ANKLE FRACTURE Left 11/23/2017   Procedure: OPEN REDUCTION INTERNAL FIXATION (ORIF) LEFT LATERAL MALLEOLUS;  Surgeon: Jerri Kay HERO, MD;  Location: [facility];  Service: Orthopedics;  Laterality: Left;   POLYPECTOMY      Family History  Problem Relation Age of Onset   Hypertension Mother    Colon cancer Neg Hx    Esophageal cancer Neg Hx    Stomach cancer Neg Hx    Rectal cancer Neg Hx    Colon polyps Neg Hx     Social History[3]  ROS Refer to HPI for ROS details.  Objective:   Vitals: BP 124/77 (BP Location: Left Arm)   Pulse 76   Temp 99.3 F  (37.4 C) (Oral)   Resp 16   SpO2 94%   Physical Exam Vitals and nursing note reviewed.  Constitutional:      General: He is not in acute distress.    Appearance: He is well-developed. He is not ill-appearing or toxic-appearing.  HENT:     Head: Normocephalic.  Cardiovascular:     Rate and Rhythm: Normal rate.  Pulmonary:     Effort: Pulmonary effort is normal. No respiratory distress.     Breath sounds: No stridor. No wheezing.  Abdominal:     General: There is no distension.     Tenderness: There is no abdominal tenderness. There is no right CVA tenderness, left CVA tenderness, guarding or rebound.  Skin:    General: Skin is warm and dry.  Neurological:     General: No focal deficit present.     Mental Status: He is alert and oriented to person, place, and time.  Psychiatric:        Mood and Affect: Mood normal.        Behavior: Behavior normal.     Procedures  No results found for this or any previous visit (from the past 24 hours).  DG Abd 1 View Result Date: 11/14/2024 CLINICAL DATA:  Abdominal pain with constipation. EXAM: ABDOMEN - 1  VIEW COMPARISON:  CT 11/20/2021 FINDINGS: No bowel dilatation or evidence of obstruction. Moderate stool within the ascending and transverse colon. Small volume of stool in the descending and sigmoid colon. No abnormal rectal distention. No radiopaque calculi. No concerning intraabdominal mass effect. On limited assessment, no acute osseous findings. IMPRESSION: Moderate stool in the ascending and transverse colon. No bowel obstruction. Electronically Signed   By: Andrea Gasman M.D.   On: 11/14/2024 16:23     Assessment and Plan :     Discharge Instructions       1. Constipation, unspecified constipation type (Primary) - DG Abd 1 View x-ray performed in UC shows moderate stool burden throughout transverse colon and descending colon, these findings are possibly consistent with moderate constipation.  Normal bowel gas pattern, no  sign of obstruction. - polyethylene glycol (MIRALAX ) 17 g packet; Take 17 g by mouth daily.  Dispense: 14 each; Refill: 0  -Continue to monitor symptoms for any change in severity if there is any escalation of current symptoms or development of new symptoms follow-up in ER for further evaluation and management.       Jervis Trapani B Trivia Heffelfinger     [1] No current facility-administered medications for this encounter.  Current Outpatient Medications:    aspirin  EC 81 MG tablet, Take 1 tablet (81 mg total) by mouth daily., Disp: 90 tablet, Rfl: 3   losartan  (COZAAR ) 100 MG tablet, TAKE 1 TABLET (100 MG TOTAL) BY MOUTH DAILY. DISCONTINUE LOSARTAN  HCT, Disp: 90 tablet, Rfl: 0   metoprolol  tartrate (LOPRESSOR ) 25 MG tablet, Take 1 tablet (25 mg total) by mouth 2 (two) times daily., Disp: 180 tablet, Rfl: 2   pantoprazole  (PROTONIX ) 40 MG tablet, Take 1 tablet (40 mg total) by mouth 2 (two) times daily., Disp: 180 tablet, Rfl: 1   polyethylene glycol (MIRALAX ) 17 g packet, Take 17 g by mouth daily., Disp: 14 each, Rfl: 0   rosuvastatin  (CRESTOR ) 10 MG tablet, TAKE 1 TABLET BY MOUTH EVERY DAY, Disp: 90 tablet, Rfl: 1   tadalafil  (CIALIS ) 5 MG tablet, Take 1 tablet by mouth once daily, Disp: 30 tablet, Rfl: 0   triamcinolone  cream (KENALOG ) 0.1 %, Apply 1 Application topically 2 (two) times daily., Disp: 30 g, Rfl: 0   albuterol  (VENTOLIN  HFA) 108 (90 Base) MCG/ACT inhaler, SMARTSIG:1 Puff(s) Via Inhaler Every 4-6 Hours PRN, Disp: , Rfl:    L-Arginine POWD, Take by mouth. Pre workout drink (Patient not taking: Reported on 11/14/2024), Disp: , Rfl:    losartan -hydrochlorothiazide (HYZAAR) 100-25 MG tablet, Take 1 tablet by mouth daily. (Patient not taking: Reported on 11/14/2024), Disp: , Rfl:    nitroGLYCERIN  (NITROSTAT ) 0.4 MG SL tablet, Place 1 tablet (0.4 mg total) under the tongue every 5 (five) minutes as needed for chest pain (up to 3 doses). (Patient not taking: Reported on 11/14/2024), Disp: 30 tablet,  Rfl: 0   tretinoin (RETIN-A) 0.025 % cream, SMARTSIG:Topical Every Evening, Disp: , Rfl:  [2]  Allergies Allergen Reactions   Apple (Diagnostic) Rash   Other Rash    apples  [3]  Social History Tobacco Use   Smoking status: Never   Smokeless tobacco: Never   Tobacco comments:    cigars- once yearly  Vaping Use   Vaping status: Never Used  Substance Use Topics   Alcohol use: Yes    Alcohol/week: 0.0 standard drinks of alcohol    Comment: Rare; holidays   Drug use: No     Aurea Goodell B, NP 11/14/24 1732  "

## 2024-11-14 NOTE — ED Triage Notes (Signed)
 Pt reports left side pain, onset 2 days ago. It now has moved to his abdomen. States he ate a questionable hamburger a few days and vomited. The next day he ate leftover turkey and began having the pain. States he feels like he needs to have a BM but hasn't been able to go. Last normal BM was 2 days ago. States he felt better after this but the the pain came back. He was going to try laxatives but was concerned because he was having the abd pain

## 2024-11-14 NOTE — Discharge Instructions (Signed)
" °  1. Constipation, unspecified constipation type (Primary) - DG Abd 1 View x-ray performed in UC shows moderate stool burden throughout transverse colon and descending colon, these findings are possibly consistent with moderate constipation.  Normal bowel gas pattern, no sign of obstruction. - polyethylene glycol (MIRALAX ) 17 g packet; Take 17 g by mouth daily.  Dispense: 14 each; Refill: 0  -Continue to monitor symptoms for any change in severity if there is any escalation of current symptoms or development of new symptoms follow-up in ER for further evaluation and management. "
